# Patient Record
Sex: Male | Born: 1935 | Race: Black or African American | Hispanic: No | Marital: Married | Smoking: Current every day smoker
Health system: Southern US, Community
[De-identification: ages and names within clinical notes are randomized; demographics above are authoritative.]

## PROBLEM LIST (undated history)

## (undated) DIAGNOSIS — I429 Cardiomyopathy, unspecified: Secondary | ICD-10-CM

## (undated) DIAGNOSIS — R413 Other amnesia: Secondary | ICD-10-CM

## (undated) DIAGNOSIS — G309 Alzheimer's disease, unspecified: Secondary | ICD-10-CM

## (undated) DIAGNOSIS — F0280 Dementia in other diseases classified elsewhere without behavioral disturbance: Secondary | ICD-10-CM

## (undated) DIAGNOSIS — F419 Anxiety disorder, unspecified: Secondary | ICD-10-CM

## (undated) HISTORY — DX: Cardiomyopathy, unspecified: I42.9

## (undated) HISTORY — DX: Other amnesia: R41.3

## (undated) HISTORY — DX: Alzheimer's disease, unspecified: G30.9

## (undated) HISTORY — DX: Dementia in other diseases classified elsewhere without behavioral disturbance: F02.80

## (undated) HISTORY — DX: Anxiety disorder, unspecified: F41.9

---

## 2007-08-17 ENCOUNTER — Ambulatory Visit: Payer: Self-pay | Admitting: Family Medicine

## 2009-11-25 ENCOUNTER — Ambulatory Visit: Payer: Self-pay | Admitting: Internal Medicine

## 2009-12-04 ENCOUNTER — Ambulatory Visit: Payer: Self-pay | Admitting: Internal Medicine

## 2009-12-26 ENCOUNTER — Ambulatory Visit: Payer: Self-pay | Admitting: Internal Medicine

## 2010-02-26 ENCOUNTER — Ambulatory Visit: Payer: Self-pay | Admitting: Internal Medicine

## 2010-07-09 ENCOUNTER — Ambulatory Visit: Payer: Self-pay | Admitting: Internal Medicine

## 2010-07-21 ENCOUNTER — Ambulatory Visit: Payer: Self-pay | Admitting: Internal Medicine

## 2010-09-25 ENCOUNTER — Ambulatory Visit: Payer: Self-pay | Admitting: Internal Medicine

## 2010-10-19 ENCOUNTER — Emergency Department: Payer: Self-pay | Admitting: Emergency Medicine

## 2010-11-09 ENCOUNTER — Ambulatory Visit: Payer: Self-pay | Admitting: Internal Medicine

## 2010-12-03 ENCOUNTER — Ambulatory Visit: Payer: Self-pay | Admitting: Cardiothoracic Surgery

## 2010-12-19 ENCOUNTER — Ambulatory Visit: Payer: Self-pay | Admitting: Cardiothoracic Surgery

## 2011-02-08 DIAGNOSIS — R972 Elevated prostate specific antigen [PSA]: Secondary | ICD-10-CM | POA: Diagnosis not present

## 2011-03-01 DIAGNOSIS — I10 Essential (primary) hypertension: Secondary | ICD-10-CM | POA: Diagnosis not present

## 2011-03-01 DIAGNOSIS — F411 Generalized anxiety disorder: Secondary | ICD-10-CM | POA: Diagnosis not present

## 2011-03-02 ENCOUNTER — Ambulatory Visit: Payer: Self-pay | Admitting: Internal Medicine

## 2011-03-02 DIAGNOSIS — R05 Cough: Secondary | ICD-10-CM | POA: Diagnosis not present

## 2011-03-02 DIAGNOSIS — J40 Bronchitis, not specified as acute or chronic: Secondary | ICD-10-CM | POA: Diagnosis not present

## 2011-04-27 DIAGNOSIS — R972 Elevated prostate specific antigen [PSA]: Secondary | ICD-10-CM | POA: Diagnosis not present

## 2011-06-01 DIAGNOSIS — M79609 Pain in unspecified limb: Secondary | ICD-10-CM | POA: Diagnosis not present

## 2011-06-01 DIAGNOSIS — B351 Tinea unguium: Secondary | ICD-10-CM | POA: Diagnosis not present

## 2011-06-01 DIAGNOSIS — M948X9 Other specified disorders of cartilage, unspecified sites: Secondary | ICD-10-CM | POA: Diagnosis not present

## 2011-10-06 DIAGNOSIS — H3550 Unspecified hereditary retinal dystrophy: Secondary | ICD-10-CM | POA: Diagnosis not present

## 2011-10-06 DIAGNOSIS — Z961 Presence of intraocular lens: Secondary | ICD-10-CM | POA: Diagnosis not present

## 2011-11-05 DIAGNOSIS — R972 Elevated prostate specific antigen [PSA]: Secondary | ICD-10-CM | POA: Diagnosis not present

## 2011-11-05 DIAGNOSIS — N400 Enlarged prostate without lower urinary tract symptoms: Secondary | ICD-10-CM | POA: Diagnosis not present

## 2011-12-06 ENCOUNTER — Emergency Department: Payer: Self-pay | Admitting: Emergency Medicine

## 2011-12-06 DIAGNOSIS — S298XXA Other specified injuries of thorax, initial encounter: Secondary | ICD-10-CM | POA: Diagnosis not present

## 2011-12-06 DIAGNOSIS — Z79899 Other long term (current) drug therapy: Secondary | ICD-10-CM | POA: Diagnosis not present

## 2011-12-06 DIAGNOSIS — F329 Major depressive disorder, single episode, unspecified: Secondary | ICD-10-CM | POA: Diagnosis not present

## 2011-12-06 DIAGNOSIS — F17200 Nicotine dependence, unspecified, uncomplicated: Secondary | ICD-10-CM | POA: Diagnosis not present

## 2011-12-06 DIAGNOSIS — R1011 Right upper quadrant pain: Secondary | ICD-10-CM | POA: Diagnosis not present

## 2011-12-06 DIAGNOSIS — S301XXA Contusion of abdominal wall, initial encounter: Secondary | ICD-10-CM | POA: Diagnosis not present

## 2011-12-06 DIAGNOSIS — S20219A Contusion of unspecified front wall of thorax, initial encounter: Secondary | ICD-10-CM | POA: Diagnosis not present

## 2011-12-06 DIAGNOSIS — S3981XA Other specified injuries of abdomen, initial encounter: Secondary | ICD-10-CM | POA: Diagnosis not present

## 2011-12-06 DIAGNOSIS — I10 Essential (primary) hypertension: Secondary | ICD-10-CM | POA: Diagnosis not present

## 2011-12-06 DIAGNOSIS — R079 Chest pain, unspecified: Secondary | ICD-10-CM | POA: Diagnosis not present

## 2011-12-06 DIAGNOSIS — J449 Chronic obstructive pulmonary disease, unspecified: Secondary | ICD-10-CM | POA: Diagnosis not present

## 2011-12-06 LAB — BASIC METABOLIC PANEL
Anion Gap: 4 — ABNORMAL LOW (ref 7–16)
BUN: 29 mg/dL — ABNORMAL HIGH (ref 7–18)
Calcium, Total: 9.6 mg/dL (ref 8.5–10.1)
Chloride: 102 mmol/L (ref 98–107)
Co2: 29 mmol/L (ref 21–32)
EGFR (Non-African Amer.): 43 — ABNORMAL LOW
Osmolality: 276 (ref 275–301)
Potassium: 4.7 mmol/L (ref 3.5–5.1)

## 2011-12-06 LAB — URINALYSIS, COMPLETE
Blood: NEGATIVE
Ketone: NEGATIVE
Leukocyte Esterase: NEGATIVE
Nitrite: NEGATIVE
Ph: 6 (ref 4.5–8.0)
Protein: NEGATIVE
RBC,UR: NONE SEEN /HPF (ref 0–5)
Specific Gravity: 1.011 (ref 1.003–1.030)
WBC UR: <1 /HPF (ref 0–5)

## 2012-01-27 DIAGNOSIS — Z87891 Personal history of nicotine dependence: Secondary | ICD-10-CM | POA: Diagnosis not present

## 2012-01-27 DIAGNOSIS — M25519 Pain in unspecified shoulder: Secondary | ICD-10-CM | POA: Diagnosis not present

## 2012-01-27 DIAGNOSIS — D649 Anemia, unspecified: Secondary | ICD-10-CM | POA: Diagnosis not present

## 2012-01-27 DIAGNOSIS — I10 Essential (primary) hypertension: Secondary | ICD-10-CM | POA: Diagnosis not present

## 2012-03-31 DIAGNOSIS — I10 Essential (primary) hypertension: Secondary | ICD-10-CM | POA: Diagnosis not present

## 2012-03-31 DIAGNOSIS — Z87891 Personal history of nicotine dependence: Secondary | ICD-10-CM | POA: Diagnosis not present

## 2012-03-31 DIAGNOSIS — I32 Pericarditis in diseases classified elsewhere: Secondary | ICD-10-CM | POA: Diagnosis not present

## 2012-03-31 DIAGNOSIS — D649 Anemia, unspecified: Secondary | ICD-10-CM | POA: Diagnosis not present

## 2012-08-04 DIAGNOSIS — M779 Enthesopathy, unspecified: Secondary | ICD-10-CM | POA: Diagnosis not present

## 2012-08-04 DIAGNOSIS — Q6689 Other  specified congenital deformities of feet: Secondary | ICD-10-CM | POA: Diagnosis not present

## 2012-09-27 DIAGNOSIS — IMO0001 Reserved for inherently not codable concepts without codable children: Secondary | ICD-10-CM | POA: Diagnosis not present

## 2012-09-29 DIAGNOSIS — D649 Anemia, unspecified: Secondary | ICD-10-CM | POA: Diagnosis not present

## 2012-09-29 DIAGNOSIS — I10 Essential (primary) hypertension: Secondary | ICD-10-CM | POA: Diagnosis not present

## 2012-09-29 DIAGNOSIS — M25519 Pain in unspecified shoulder: Secondary | ICD-10-CM | POA: Diagnosis not present

## 2012-09-29 DIAGNOSIS — IMO0002 Reserved for concepts with insufficient information to code with codable children: Secondary | ICD-10-CM | POA: Diagnosis not present

## 2012-11-06 DIAGNOSIS — R972 Elevated prostate specific antigen [PSA]: Secondary | ICD-10-CM | POA: Diagnosis not present

## 2013-01-22 DIAGNOSIS — I129 Hypertensive chronic kidney disease with stage 1 through stage 4 chronic kidney disease, or unspecified chronic kidney disease: Secondary | ICD-10-CM | POA: Diagnosis not present

## 2013-01-22 DIAGNOSIS — N181 Chronic kidney disease, stage 1: Secondary | ICD-10-CM | POA: Diagnosis not present

## 2013-01-22 DIAGNOSIS — R413 Other amnesia: Secondary | ICD-10-CM | POA: Diagnosis not present

## 2013-01-22 DIAGNOSIS — J449 Chronic obstructive pulmonary disease, unspecified: Secondary | ICD-10-CM | POA: Diagnosis not present

## 2013-01-22 DIAGNOSIS — F17200 Nicotine dependence, unspecified, uncomplicated: Secondary | ICD-10-CM | POA: Diagnosis not present

## 2013-04-30 DIAGNOSIS — R32 Unspecified urinary incontinence: Secondary | ICD-10-CM | POA: Diagnosis not present

## 2013-04-30 DIAGNOSIS — N400 Enlarged prostate without lower urinary tract symptoms: Secondary | ICD-10-CM | POA: Diagnosis not present

## 2013-06-07 DIAGNOSIS — M898X9 Other specified disorders of bone, unspecified site: Secondary | ICD-10-CM | POA: Diagnosis not present

## 2013-06-07 DIAGNOSIS — M779 Enthesopathy, unspecified: Secondary | ICD-10-CM | POA: Diagnosis not present

## 2013-06-07 DIAGNOSIS — Q6689 Other  specified congenital deformities of feet: Secondary | ICD-10-CM | POA: Diagnosis not present

## 2013-06-19 DIAGNOSIS — J449 Chronic obstructive pulmonary disease, unspecified: Secondary | ICD-10-CM | POA: Diagnosis not present

## 2013-06-19 DIAGNOSIS — N183 Chronic kidney disease, stage 3 unspecified: Secondary | ICD-10-CM | POA: Diagnosis not present

## 2013-06-19 DIAGNOSIS — I10 Essential (primary) hypertension: Secondary | ICD-10-CM | POA: Diagnosis not present

## 2013-06-19 DIAGNOSIS — F0390 Unspecified dementia without behavioral disturbance: Secondary | ICD-10-CM | POA: Diagnosis not present

## 2013-07-09 DIAGNOSIS — M79609 Pain in unspecified limb: Secondary | ICD-10-CM | POA: Diagnosis not present

## 2013-07-09 DIAGNOSIS — M19079 Primary osteoarthritis, unspecified ankle and foot: Secondary | ICD-10-CM | POA: Diagnosis not present

## 2013-07-09 DIAGNOSIS — M779 Enthesopathy, unspecified: Secondary | ICD-10-CM | POA: Diagnosis not present

## 2013-08-07 DIAGNOSIS — E785 Hyperlipidemia, unspecified: Secondary | ICD-10-CM | POA: Diagnosis not present

## 2013-08-07 DIAGNOSIS — G3183 Dementia with Lewy bodies: Secondary | ICD-10-CM | POA: Diagnosis not present

## 2013-08-07 DIAGNOSIS — R634 Abnormal weight loss: Secondary | ICD-10-CM | POA: Diagnosis not present

## 2013-08-07 DIAGNOSIS — E039 Hypothyroidism, unspecified: Secondary | ICD-10-CM | POA: Diagnosis not present

## 2013-08-07 DIAGNOSIS — R413 Other amnesia: Secondary | ICD-10-CM | POA: Diagnosis not present

## 2013-08-07 DIAGNOSIS — R197 Diarrhea, unspecified: Secondary | ICD-10-CM | POA: Diagnosis not present

## 2013-08-07 DIAGNOSIS — F0280 Dementia in other diseases classified elsewhere without behavioral disturbance: Secondary | ICD-10-CM | POA: Diagnosis not present

## 2013-08-07 DIAGNOSIS — F0390 Unspecified dementia without behavioral disturbance: Secondary | ICD-10-CM | POA: Diagnosis not present

## 2013-08-07 DIAGNOSIS — I10 Essential (primary) hypertension: Secondary | ICD-10-CM | POA: Diagnosis not present

## 2013-08-20 DIAGNOSIS — R413 Other amnesia: Secondary | ICD-10-CM | POA: Diagnosis not present

## 2013-09-20 DIAGNOSIS — B351 Tinea unguium: Secondary | ICD-10-CM | POA: Diagnosis not present

## 2013-09-20 DIAGNOSIS — M79609 Pain in unspecified limb: Secondary | ICD-10-CM | POA: Diagnosis not present

## 2013-10-22 DIAGNOSIS — G309 Alzheimer's disease, unspecified: Secondary | ICD-10-CM | POA: Diagnosis not present

## 2013-10-22 DIAGNOSIS — F0280 Dementia in other diseases classified elsewhere without behavioral disturbance: Secondary | ICD-10-CM

## 2013-10-22 DIAGNOSIS — R413 Other amnesia: Secondary | ICD-10-CM

## 2013-10-22 HISTORY — DX: Dementia in other diseases classified elsewhere, unspecified severity, without behavioral disturbance, psychotic disturbance, mood disturbance, and anxiety: F02.80

## 2013-10-22 HISTORY — DX: Other amnesia: R41.3

## 2014-01-23 DIAGNOSIS — R413 Other amnesia: Secondary | ICD-10-CM | POA: Diagnosis not present

## 2014-01-30 DIAGNOSIS — I10 Essential (primary) hypertension: Secondary | ICD-10-CM | POA: Diagnosis not present

## 2014-06-14 DIAGNOSIS — F064 Anxiety disorder due to known physiological condition: Secondary | ICD-10-CM | POA: Diagnosis not present

## 2014-06-14 DIAGNOSIS — J449 Chronic obstructive pulmonary disease, unspecified: Secondary | ICD-10-CM | POA: Diagnosis not present

## 2014-06-14 DIAGNOSIS — Z87891 Personal history of nicotine dependence: Secondary | ICD-10-CM | POA: Diagnosis not present

## 2014-06-14 DIAGNOSIS — I10 Essential (primary) hypertension: Secondary | ICD-10-CM | POA: Diagnosis not present

## 2014-08-15 DIAGNOSIS — T783XXA Angioneurotic edema, initial encounter: Secondary | ICD-10-CM | POA: Diagnosis not present

## 2014-08-15 DIAGNOSIS — F0280 Dementia in other diseases classified elsewhere without behavioral disturbance: Secondary | ICD-10-CM | POA: Diagnosis not present

## 2014-08-15 DIAGNOSIS — L03314 Cellulitis of groin: Secondary | ICD-10-CM | POA: Diagnosis not present

## 2014-08-15 DIAGNOSIS — G309 Alzheimer's disease, unspecified: Secondary | ICD-10-CM | POA: Diagnosis not present

## 2014-08-27 DIAGNOSIS — G309 Alzheimer's disease, unspecified: Secondary | ICD-10-CM | POA: Diagnosis not present

## 2014-08-27 DIAGNOSIS — K611 Rectal abscess: Secondary | ICD-10-CM | POA: Diagnosis not present

## 2014-08-27 DIAGNOSIS — F0280 Dementia in other diseases classified elsewhere without behavioral disturbance: Secondary | ICD-10-CM | POA: Diagnosis not present

## 2014-08-27 DIAGNOSIS — L98499 Non-pressure chronic ulcer of skin of other sites with unspecified severity: Secondary | ICD-10-CM | POA: Diagnosis not present

## 2014-09-11 ENCOUNTER — Ambulatory Visit: Payer: Self-pay | Admitting: Urology

## 2014-10-01 ENCOUNTER — Ambulatory Visit (INDEPENDENT_AMBULATORY_CARE_PROVIDER_SITE_OTHER): Payer: Medicare Other | Admitting: Urology

## 2014-10-01 ENCOUNTER — Encounter: Payer: Self-pay | Admitting: Urology

## 2014-10-01 VITALS — BP 103/66 | HR 90 | Ht 68.0 in | Wt 157.9 lb

## 2014-10-01 DIAGNOSIS — N400 Enlarged prostate without lower urinary tract symptoms: Secondary | ICD-10-CM | POA: Diagnosis not present

## 2014-10-01 DIAGNOSIS — I429 Cardiomyopathy, unspecified: Secondary | ICD-10-CM | POA: Insufficient documentation

## 2014-10-01 HISTORY — DX: Cardiomyopathy, unspecified: I42.9

## 2014-10-01 NOTE — Progress Notes (Signed)
10/01/2014 11:03 AM   Gene [name] [birth year] [id number]  Referring provider: No referring provider defined for this encounter.  Chief Complaint  Patient presents with  . scrotum cyst    pt was started put the pt on abx x 1.5 mths ago    HPI: History remotely of possible mass in the rectum or perineal area. Nonpresent otherwise patient has a vaginal by occasional may be once in a week or once a month or he has some incontinence on the way to the bathroom or incontinence without knowing his voided. This is very rare. Wears no pads he had some nighttime incontinence but was placed on a pattern of awakening him at nigh to urinate He should maintain his tamsulosin.  He should be seen on a yearly basis no PSAs are necessary at this time as he is 81 years old.    PMH: Past Medical History  Diagnosis Date  . Cardiomyopathy 10/01/2014  . AD (Alzheimer's disease) 10/22/2013  . Amnesia 10/22/2013  . Anxiety     Surgical History: No past surgical history on file.  Home Medications:    Medication List       This list is accurate as of: 10/01/14 11:03 AM.  Always use your most recent med list.               ADVAIR DISKUS 250-50 MCG/DOSE Aepb  Generic drug:  Fluticasone-Salmeterol  Inhale into the lungs.     aspirin EC 81 MG tablet  Take by mouth.     Calcium Carbonate-Vitamin D 600-400 MG-UNIT per tablet  Take by mouth.     COLACE 100 MG capsule  Generic drug:  docusate sodium  Take by mouth.     donepezil 10 MG tablet  Commonly known as:  ARICEPT  Take by mouth.     escitalopram 20 MG tablet  Commonly known as:  LEXAPRO  Take by mouth.     fexofenadine 180 MG tablet  Commonly known as:  ALLEGRA  Take by mouth.     lisinopril 5 MG tablet  Commonly known as:  PRINIVIL,ZESTRIL  Take by mouth.     LORazepam 0.5 MG tablet  Commonly known as:  ATIVAN  Take by mouth.     memantine 5 MG tablet  Commonly known as:  NAMENDA  Take by mouth.     MULTIVITAMIN &  MINERAL PO  Take by mouth.     tamsulosin 0.4 MG Caps capsule  Commonly known as:  FLOMAX  Take by mouth.     tiotropium 18 MCG inhalation capsule  Commonly known as:  Browns into inhaler and inhale.     TOVIAZ 4 MG Tb24 tablet  Generic drug:  fesoterodine  Take by mouth.     XOPENEX 0.63 MG/3ML nebulizer solution  Generic drug:  levalbuterol  Inhale into the lungs.     XOPENEX HFA 45 MCG/ACT inhaler  Generic drug:  levalbuterol  2 puff by inhalation every 4 to 6 hours PRN        Allergies: No Known Allergies  Family History: No family history on file.  Social History:  reports that he has been smoking Cigarettes.  He has been smoking about 0.25 packs per day. He does not have any smokeless tobacco history on file. He reports that he does not drink alcohol or use illicit drugs.  ROS: UROLOGY Frequent Urination?: No Hard to postpone urination?: No Burning/pain with urination?: No Get up at night to urinate?: No  Leakage of urine?: No Urine stream starts and stops?: No Trouble starting stream?: No Do you have to strain to urinate?: No Blood in urine?: No Urinary tract infection?: No Sexually transmitted disease?: No Injury to kidneys or bladder?: No Painful intercourse?: No Weak stream?: No Erection problems?: No Penile pain?: No  Gastrointestinal Nausea?: No Vomiting?: No Indigestion/heartburn?: No Diarrhea?: No Constipation?: No  Constitutional Fever: No Night sweats?: No Weight loss?: No Fatigue?: No  Skin Skin rash/lesions?: No Itching?: No  Eyes Blurred vision?: No Double vision?: No  Ears/Nose/Throat Sore throat?: No Sinus problems?: No  Hematologic/Lymphatic Swollen glands?: No Easy bruising?: No  Cardiovascular Leg swelling?: No Chest pain?: No  Respiratory Cough?: No Shortness of breath?: No  Endocrine Excessive thirst?: No  Musculoskeletal Back pain?: No Joint pain?: No  Neurological Headaches?:  No Dizziness?: No  Psychologic Depression?: No Anxiety?: No  Physical Exam: BP 103/66 mmHg  Pulse 90  Ht '5\' 8"'$  (1.727 m)  Wt 157 lb 14.4 oz (71.623 kg)  BMI 24.01 kg/m2  Constitutional:  Alert and oriented, No acute distress. HEENT: [place] AT, moist mucus membranes.  Trachea midline, no masses. Cardiovascular: No clubbing, cyanosis, or edema. Respiratory: Normal respiratory effort, no increased work of breathing. GI: Abdomen is soft, nontender, nondistended, no abdominal masses GU: No CVA tenderness. Small prostate nonnodular no history of prostate cancer. Uncircumcised penis retraction the foreskin is accomplished easily no masses. Foreskin. Scrotum normal testes descended. No at no abscess present no past abscess scarring present rectal sphincter excellent no rectal masses Skin: No rashes, bruises or suspicious lesions. Lymph: No cervical or inguinal adenopathy. Neurologic: Grossly intact, no focal deficits, moving all 4 extremities. Psychiatric: Normal mood and affect.  Laboratory Data: No results found for: WBC, HGB, HCT, MCV, PLT  Lab Results  Component Value Date   CREATININE 1.48* 12/06/2011    No results found for: PSA  No results found for: TESTOSTERONE  No results found for: HGBA1C  Urinalysis    Component Value Date/Time   COLORURINE Yellow 12/06/2011 1313   APPEARANCEUR Clear 12/06/2011 1313   LABSPEC 1.011 12/06/2011 1313   PHURINE 6.0 12/06/2011 1313   GLUCOSEU Negative 12/06/2011 1313   HGBUR Negative 12/06/2011 1313   BILIRUBINUR Negative 12/06/2011 1313   KETONESUR Negative 12/06/2011 1313   PROTEINUR Negative 12/06/2011 1313   NITRITE Negative 12/06/2011 1313   LEUKOCYTESUR Negative 12/06/2011 1313    Pertinent Imaging: None  Assessment & Plan: Resolved perineal scrotal abscess with occasional incontinence that has been addressed by timed voiding and getting up one time at night to make sure the patient voids for his age prostate is exceptionally  small no nodules. He still is having good erectile function  There are no diagnoses linked to this encounter.  No Follow-up on file.  Collier Flowers, [facility] [address], [place], [place] [address] [phone]

## 2014-10-22 DIAGNOSIS — R413 Other amnesia: Secondary | ICD-10-CM | POA: Diagnosis not present

## 2014-11-11 DIAGNOSIS — R413 Other amnesia: Secondary | ICD-10-CM | POA: Diagnosis not present

## 2015-01-21 DIAGNOSIS — T783XXA Angioneurotic edema, initial encounter: Secondary | ICD-10-CM | POA: Diagnosis not present

## 2015-01-21 DIAGNOSIS — J449 Chronic obstructive pulmonary disease, unspecified: Secondary | ICD-10-CM | POA: Diagnosis not present

## 2015-01-21 DIAGNOSIS — F064 Anxiety disorder due to known physiological condition: Secondary | ICD-10-CM | POA: Diagnosis not present

## 2015-01-21 DIAGNOSIS — I10 Essential (primary) hypertension: Secondary | ICD-10-CM | POA: Diagnosis not present

## 2015-04-21 DIAGNOSIS — T783XXA Angioneurotic edema, initial encounter: Secondary | ICD-10-CM | POA: Diagnosis not present

## 2015-04-21 DIAGNOSIS — J449 Chronic obstructive pulmonary disease, unspecified: Secondary | ICD-10-CM | POA: Diagnosis not present

## 2015-04-21 DIAGNOSIS — I10 Essential (primary) hypertension: Secondary | ICD-10-CM | POA: Diagnosis not present

## 2015-04-21 DIAGNOSIS — Z87891 Personal history of nicotine dependence: Secondary | ICD-10-CM | POA: Diagnosis not present

## 2015-07-03 DIAGNOSIS — J449 Chronic obstructive pulmonary disease, unspecified: Secondary | ICD-10-CM | POA: Diagnosis not present

## 2015-07-03 DIAGNOSIS — I10 Essential (primary) hypertension: Secondary | ICD-10-CM | POA: Diagnosis not present

## 2015-07-03 DIAGNOSIS — T783XXA Angioneurotic edema, initial encounter: Secondary | ICD-10-CM | POA: Diagnosis not present

## 2015-07-03 DIAGNOSIS — Z87891 Personal history of nicotine dependence: Secondary | ICD-10-CM | POA: Diagnosis not present

## 2015-09-02 DIAGNOSIS — Z87891 Personal history of nicotine dependence: Secondary | ICD-10-CM | POA: Diagnosis not present

## 2015-09-02 DIAGNOSIS — R5381 Other malaise: Secondary | ICD-10-CM | POA: Diagnosis not present

## 2015-09-02 DIAGNOSIS — E784 Other hyperlipidemia: Secondary | ICD-10-CM | POA: Diagnosis not present

## 2015-09-02 DIAGNOSIS — J449 Chronic obstructive pulmonary disease, unspecified: Secondary | ICD-10-CM | POA: Diagnosis not present

## 2015-09-02 DIAGNOSIS — R634 Abnormal weight loss: Secondary | ICD-10-CM | POA: Diagnosis not present

## 2015-09-02 DIAGNOSIS — I10 Essential (primary) hypertension: Secondary | ICD-10-CM | POA: Diagnosis not present

## 2015-09-05 ENCOUNTER — Ambulatory Visit
Admission: RE | Admit: 2015-09-05 | Discharge: 2015-09-05 | Disposition: A | Discharge status: Home / Self Care | Payer: Medicare Other | Source: Ambulatory Visit | Attending: Internal Medicine | Admitting: Internal Medicine

## 2015-09-05 ENCOUNTER — Other Ambulatory Visit: Payer: Self-pay | Admitting: Internal Medicine

## 2015-09-05 DIAGNOSIS — F17200 Nicotine dependence, unspecified, uncomplicated: Secondary | ICD-10-CM | POA: Insufficient documentation

## 2015-09-05 DIAGNOSIS — R634 Abnormal weight loss: Secondary | ICD-10-CM | POA: Diagnosis not present

## 2015-09-05 DIAGNOSIS — J449 Chronic obstructive pulmonary disease, unspecified: Secondary | ICD-10-CM | POA: Diagnosis not present

## 2015-09-05 DIAGNOSIS — R918 Other nonspecific abnormal finding of lung field: Secondary | ICD-10-CM | POA: Insufficient documentation

## 2015-09-09 DIAGNOSIS — G309 Alzheimer's disease, unspecified: Secondary | ICD-10-CM | POA: Diagnosis not present

## 2015-09-09 DIAGNOSIS — R918 Other nonspecific abnormal finding of lung field: Secondary | ICD-10-CM | POA: Diagnosis not present

## 2015-09-09 DIAGNOSIS — F0280 Dementia in other diseases classified elsewhere without behavioral disturbance: Secondary | ICD-10-CM | POA: Diagnosis not present

## 2015-09-09 DIAGNOSIS — Z87891 Personal history of nicotine dependence: Secondary | ICD-10-CM | POA: Diagnosis not present

## 2015-09-12 ENCOUNTER — Other Ambulatory Visit: Payer: Self-pay | Admitting: Cardiology

## 2015-09-12 DIAGNOSIS — R9389 Abnormal findings on diagnostic imaging of other specified body structures: Secondary | ICD-10-CM

## 2015-09-19 ENCOUNTER — Ambulatory Visit
Admission: RE | Admit: 2015-09-19 | Discharge: 2015-09-19 | Disposition: A | Discharge status: Home / Self Care | Payer: Medicare Other | Source: Ambulatory Visit | Attending: Cardiology | Admitting: Cardiology

## 2015-09-19 DIAGNOSIS — R918 Other nonspecific abnormal finding of lung field: Secondary | ICD-10-CM | POA: Diagnosis not present

## 2015-09-19 DIAGNOSIS — J90 Pleural effusion, not elsewhere classified: Secondary | ICD-10-CM | POA: Insufficient documentation

## 2015-09-19 DIAGNOSIS — C3410 Malignant neoplasm of upper lobe, unspecified bronchus or lung: Secondary | ICD-10-CM | POA: Diagnosis not present

## 2015-09-19 DIAGNOSIS — R590 Localized enlarged lymph nodes: Secondary | ICD-10-CM | POA: Insufficient documentation

## 2015-09-19 DIAGNOSIS — I2510 Atherosclerotic heart disease of native coronary artery without angina pectoris: Secondary | ICD-10-CM | POA: Insufficient documentation

## 2015-09-19 DIAGNOSIS — R9389 Abnormal findings on diagnostic imaging of other specified body structures: Secondary | ICD-10-CM

## 2015-09-19 DIAGNOSIS — I700 Atherosclerosis of aorta: Secondary | ICD-10-CM | POA: Insufficient documentation

## 2015-09-19 LAB — POCT I-STAT CREATININE: CREATININE: 1.8 mg/dL — AB (ref 0.61–1.24)

## 2015-09-19 MED ORDER — IOPAMIDOL (ISOVUE-300) INJECTION 61%: 75.0000 mL | Freq: Once | INTRAVENOUS | Status: DC | PRN

## 2015-09-19 MED ORDER — IOPAMIDOL (ISOVUE-300) INJECTION 61%
60.0000 mL | Freq: Once | INTRAVENOUS | Status: AC | PRN
  Administered 2015-09-19: 60 mL via INTRAVENOUS

## 2016-02-04 ENCOUNTER — Inpatient Hospital Stay: Payer: Medicare Other

## 2016-02-04 ENCOUNTER — Inpatient Hospital Stay
Admission: EM | Admit: 2016-02-04 | Discharge: 2016-02-19 | DRG: 871 | Disposition: E | Payer: Medicare Other | Attending: Internal Medicine | Admitting: Internal Medicine

## 2016-02-04 ENCOUNTER — Emergency Department: Payer: Medicare Other

## 2016-02-04 ENCOUNTER — Encounter: Payer: Self-pay | Admitting: Emergency Medicine

## 2016-02-04 DIAGNOSIS — E162 Hypoglycemia, unspecified: Secondary | ICD-10-CM | POA: Diagnosis present

## 2016-02-04 DIAGNOSIS — N179 Acute kidney failure, unspecified: Secondary | ICD-10-CM | POA: Diagnosis not present

## 2016-02-04 DIAGNOSIS — F17210 Nicotine dependence, cigarettes, uncomplicated: Secondary | ICD-10-CM | POA: Diagnosis present

## 2016-02-04 DIAGNOSIS — E870 Hyperosmolality and hypernatremia: Secondary | ICD-10-CM | POA: Diagnosis present

## 2016-02-04 DIAGNOSIS — N390 Urinary tract infection, site not specified: Secondary | ICD-10-CM | POA: Diagnosis present

## 2016-02-04 DIAGNOSIS — R4182 Altered mental status, unspecified: Secondary | ICD-10-CM | POA: Diagnosis not present

## 2016-02-04 DIAGNOSIS — R55 Syncope and collapse: Secondary | ICD-10-CM | POA: Diagnosis not present

## 2016-02-04 DIAGNOSIS — F0280 Dementia in other diseases classified elsewhere without behavioral disturbance: Secondary | ICD-10-CM | POA: Diagnosis not present

## 2016-02-04 DIAGNOSIS — D649 Anemia, unspecified: Secondary | ICD-10-CM | POA: Diagnosis not present

## 2016-02-04 DIAGNOSIS — Z66 Do not resuscitate: Secondary | ICD-10-CM | POA: Diagnosis present

## 2016-02-04 DIAGNOSIS — R6521 Severe sepsis with septic shock: Secondary | ICD-10-CM | POA: Diagnosis present

## 2016-02-04 DIAGNOSIS — Z7982 Long term (current) use of aspirin: Secondary | ICD-10-CM | POA: Diagnosis not present

## 2016-02-04 DIAGNOSIS — E875 Hyperkalemia: Secondary | ICD-10-CM | POA: Diagnosis not present

## 2016-02-04 DIAGNOSIS — D6959 Other secondary thrombocytopenia: Secondary | ICD-10-CM | POA: Diagnosis present

## 2016-02-04 DIAGNOSIS — I429 Cardiomyopathy, unspecified: Secondary | ICD-10-CM | POA: Diagnosis present

## 2016-02-04 DIAGNOSIS — R4020 Unspecified coma: Secondary | ICD-10-CM | POA: Diagnosis not present

## 2016-02-04 DIAGNOSIS — L8990 Pressure ulcer of unspecified site, unspecified stage: Secondary | ICD-10-CM | POA: Insufficient documentation

## 2016-02-04 DIAGNOSIS — I959 Hypotension, unspecified: Secondary | ICD-10-CM

## 2016-02-04 DIAGNOSIS — E43 Unspecified severe protein-calorie malnutrition: Secondary | ICD-10-CM | POA: Diagnosis present

## 2016-02-04 DIAGNOSIS — E876 Hypokalemia: Secondary | ICD-10-CM | POA: Diagnosis not present

## 2016-02-04 DIAGNOSIS — C3412 Malignant neoplasm of upper lobe, left bronchus or lung: Secondary | ICD-10-CM | POA: Diagnosis present

## 2016-02-04 DIAGNOSIS — R4189 Other symptoms and signs involving cognitive functions and awareness: Secondary | ICD-10-CM

## 2016-02-04 DIAGNOSIS — I130 Hypertensive heart and chronic kidney disease with heart failure and stage 1 through stage 4 chronic kidney disease, or unspecified chronic kidney disease: Secondary | ICD-10-CM | POA: Diagnosis present

## 2016-02-04 DIAGNOSIS — G309 Alzheimer's disease, unspecified: Secondary | ICD-10-CM | POA: Diagnosis not present

## 2016-02-04 DIAGNOSIS — A480 Gas gangrene: Secondary | ICD-10-CM | POA: Diagnosis not present

## 2016-02-04 DIAGNOSIS — E872 Acidosis: Secondary | ICD-10-CM | POA: Diagnosis present

## 2016-02-04 DIAGNOSIS — L309 Dermatitis, unspecified: Secondary | ICD-10-CM | POA: Diagnosis not present

## 2016-02-04 DIAGNOSIS — A419 Sepsis, unspecified organism: Principal | ICD-10-CM | POA: Diagnosis present

## 2016-02-04 DIAGNOSIS — C7800 Secondary malignant neoplasm of unspecified lung: Secondary | ICD-10-CM | POA: Diagnosis present

## 2016-02-04 DIAGNOSIS — Z681 Body mass index (BMI) 19 or less, adult: Secondary | ICD-10-CM

## 2016-02-04 DIAGNOSIS — L89892 Pressure ulcer of other site, stage 2: Secondary | ICD-10-CM | POA: Diagnosis present

## 2016-02-04 DIAGNOSIS — N183 Chronic kidney disease, stage 3 (moderate): Secondary | ICD-10-CM | POA: Diagnosis present

## 2016-02-04 DIAGNOSIS — Z79899 Other long term (current) drug therapy: Secondary | ICD-10-CM | POA: Diagnosis not present

## 2016-02-04 DIAGNOSIS — D631 Anemia in chronic kidney disease: Secondary | ICD-10-CM | POA: Diagnosis present

## 2016-02-04 DIAGNOSIS — D62 Acute posthemorrhagic anemia: Secondary | ICD-10-CM | POA: Diagnosis not present

## 2016-02-04 DIAGNOSIS — G40909 Epilepsy, unspecified, not intractable, without status epilepticus: Secondary | ICD-10-CM | POA: Diagnosis present

## 2016-02-04 DIAGNOSIS — Z515 Encounter for palliative care: Secondary | ICD-10-CM | POA: Diagnosis present

## 2016-02-04 DIAGNOSIS — Z7189 Other specified counseling: Secondary | ICD-10-CM | POA: Diagnosis not present

## 2016-02-04 DIAGNOSIS — I96 Gangrene, not elsewhere classified: Secondary | ICD-10-CM | POA: Diagnosis present

## 2016-02-04 DIAGNOSIS — I509 Heart failure, unspecified: Secondary | ICD-10-CM | POA: Diagnosis present

## 2016-02-04 LAB — COMPREHENSIVE METABOLIC PANEL
ALK PHOS: 77 U/L (ref 38–126)
ALT: 19 U/L (ref 17–63)
AST: 66 U/L — AB (ref 15–41)
Albumin: 1.6 g/dL — ABNORMAL LOW (ref 3.5–5.0)
Anion gap: 24 — ABNORMAL HIGH (ref 5–15)
BILIRUBIN TOTAL: 0.9 mg/dL (ref 0.3–1.2)
BUN: 111 mg/dL — AB (ref 6–20)
CALCIUM: 7 mg/dL — AB (ref 8.9–10.3)
CO2: 14 mmol/L — ABNORMAL LOW (ref 22–32)
CREATININE: 4.97 mg/dL — AB (ref 0.61–1.24)
Chloride: 121 mmol/L — ABNORMAL HIGH (ref 101–111)
GFR calc Af Amer: 11 mL/min — ABNORMAL LOW (ref >60)
GFR calc non Af Amer: 9 mL/min — ABNORMAL LOW (ref >60)
GLUCOSE: 409 mg/dL — AB (ref 65–99)
POTASSIUM: 6.7 mmol/L — AB (ref 3.5–5.1)
Sodium: 159 mmol/L — ABNORMAL HIGH (ref 135–145)
TOTAL PROTEIN: 4.7 g/dL — AB (ref 6.5–8.1)

## 2016-02-04 LAB — CBC WITH DIFFERENTIAL/PLATELET
BASOS ABS: 0.1 10*3/uL (ref 0–0.1)
Basophils Relative: 0 %
EOS ABS: 0 10*3/uL (ref 0–0.7)
EOS PCT: 0 %
HCT: 19.3 % — ABNORMAL LOW (ref 40.0–52.0)
Hemoglobin: 5.4 g/dL — ABNORMAL LOW (ref 13.0–18.0)
LYMPHS PCT: 2 %
Lymphs Abs: 0.4 10*3/uL — ABNORMAL LOW (ref 1.0–3.6)
MCH: 19.8 pg — AB (ref 26.0–34.0)
MCHC: 28 g/dL — ABNORMAL LOW (ref 32.0–36.0)
MCV: 70.9 fL — AB (ref 80.0–100.0)
MONO ABS: 0.5 10*3/uL (ref 0.2–1.0)
Monocytes Relative: 2 %
Neutro Abs: 23 10*3/uL — ABNORMAL HIGH (ref 1.4–6.5)
Neutrophils Relative %: 96 %
PLATELETS: 93 10*3/uL — AB (ref 150–440)
RBC: 2.73 MIL/uL — ABNORMAL LOW (ref 4.40–5.90)
RDW: 21.8 % — AB (ref 11.5–14.5)
WBC: 24 10*3/uL — ABNORMAL HIGH (ref 3.8–10.6)

## 2016-02-04 LAB — URINALYSIS, ROUTINE W REFLEX MICROSCOPIC
Bacteria, UA: NONE SEEN
Bilirubin Urine: NEGATIVE
Glucose, UA: 50 mg/dL — AB
Ketones, ur: NEGATIVE mg/dL
LEUKOCYTES UA: NEGATIVE
Nitrite: NEGATIVE
PROTEIN: 100 mg/dL — AB
Specific Gravity, Urine: 1.016 (ref 1.005–1.030)
pH: 5 (ref 5.0–8.0)

## 2016-02-04 LAB — GLUCOSE, CAPILLARY
GLUCOSE-CAPILLARY: 111 mg/dL — AB (ref 65–99)
GLUCOSE-CAPILLARY: 126 mg/dL — AB (ref 65–99)
GLUCOSE-CAPILLARY: 152 mg/dL — AB (ref 65–99)
Glucose-Capillary: 116 mg/dL — ABNORMAL HIGH (ref 65–99)
Glucose-Capillary: 97 mg/dL (ref 65–99)
Glucose-Capillary: 122 mg/dL — ABNORMAL HIGH (ref 65–99)
Glucose-Capillary: 165 mg/dL — ABNORMAL HIGH (ref 65–99)
Glucose-Capillary: 122 mg/dL — ABNORMAL HIGH (ref 65–99)
Glucose-Capillary: 106 mg/dL — ABNORMAL HIGH (ref 65–99)
Glucose-Capillary: 76 mg/dL (ref 65–99)
Glucose-Capillary: 98 mg/dL (ref 65–99)

## 2016-02-04 LAB — CBC
HCT: 19.2 % — ABNORMAL LOW (ref 40.0–52.0)
HEMOGLOBIN: 4.8 g/dL — AB (ref 13.0–18.0)
MCH: 20.2 pg — ABNORMAL LOW (ref 26.0–34.0)
MCHC: 24.8 g/dL — ABNORMAL LOW (ref 32.0–36.0)
MCV: 81.4 fL (ref 80.0–100.0)
Platelets: 81 10*3/uL — ABNORMAL LOW (ref 150–440)
RBC: 2.36 MIL/uL — AB (ref 4.40–5.90)
RDW: 22.4 % — ABNORMAL HIGH (ref 11.5–14.5)
WBC: 27.8 10*3/uL — AB (ref 3.8–10.6)

## 2016-02-04 LAB — LACTIC ACID, PLASMA
Lactic Acid, Venous: 10.5 mmol/L (ref 0.5–1.9)
Lactic Acid, Venous: 3 mmol/L (ref 0.5–1.9)

## 2016-02-04 LAB — PROTIME-INR
INR: 1.78
PROTHROMBIN TIME: 20.9 s — AB (ref 11.4–15.2)

## 2016-02-04 LAB — SODIUM: Sodium: 158 mmol/L — ABNORMAL HIGH (ref 135–145)

## 2016-02-04 LAB — PREPARE RBC (CROSSMATCH)

## 2016-02-04 LAB — POTASSIUM: Potassium: 5.6 mmol/L — ABNORMAL HIGH (ref 3.5–5.1)

## 2016-02-04 LAB — TROPONIN I: Troponin I: 0.03 ng/mL (ref <0.03)

## 2016-02-04 LAB — APTT: aPTT: 24 seconds (ref 24–36)

## 2016-02-04 LAB — ABO/RH: ABO/RH(D): A POS

## 2016-02-04 MED ORDER — SODIUM BICARBONATE 8.4 % IV SOLN
50.0000 meq | Freq: Once | INTRAVENOUS | Status: AC
  Administered 2016-02-04: 50 meq via INTRAVENOUS
  Filled 2016-02-04: qty 50

## 2016-02-04 MED ORDER — SODIUM CHLORIDE 0.9 % IV SOLN
1.0000 g | Freq: Once | INTRAVENOUS | Status: AC
  Administered 2016-02-04: 1 g via INTRAVENOUS
  Filled 2016-02-04: qty 10

## 2016-02-04 MED ORDER — VANCOMYCIN HCL IN DEXTROSE 1-5 GM/200ML-% IV SOLN
1000.0000 mg | Freq: Once | INTRAVENOUS | Status: AC
  Administered 2016-02-04: 1000 mg via INTRAVENOUS
  Filled 2016-02-04: qty 200

## 2016-02-04 MED ORDER — DONEPEZIL HCL 5 MG PO TABS: 10.0000 mg | ORAL_TABLET | Freq: Every day | ORAL | Status: DC

## 2016-02-04 MED ORDER — SODIUM CHLORIDE 0.9 % IV SOLN: 1.0000 g | Freq: Once | INTRAVENOUS | Status: DC

## 2016-02-04 MED ORDER — SODIUM CHLORIDE 0.9 % IV SOLN
10.0000 mL/h | Freq: Once | INTRAVENOUS | Status: AC
  Administered 2016-02-04: 10 mL/h via INTRAVENOUS

## 2016-02-04 MED ORDER — SODIUM CHLORIDE 0.9 % IV BOLUS (SEPSIS)
1500.0000 mL | Freq: Once | INTRAVENOUS | Status: AC
  Administered 2016-02-04: 1500 mL via INTRAVENOUS

## 2016-02-04 MED ORDER — DEXTROSE 50 % IV SOLN
1.0000 | Freq: Once | INTRAVENOUS | Status: AC
  Administered 2016-02-04: 50 mL via INTRAVENOUS

## 2016-02-04 MED ORDER — PIPERACILLIN-TAZOBACTAM 3.375 G IVPB 30 MIN
3.3750 g | Freq: Once | INTRAVENOUS | Status: AC
  Administered 2016-02-04: 3.375 g via INTRAVENOUS
  Filled 2016-02-04: qty 50

## 2016-02-04 NOTE — ED Notes (Addendum)
Pt lying in stretcher not showing signs of distress from blood transfusion. Pt responding to name with "yea" or "huh". Pt's BP continues to run low (systolic in 33'L and diastolic in 45'G varying). Dr. Genia Harold notified of BP with no new orders at this time for BP.

## 2016-02-04 NOTE — ED Notes (Signed)
Pt had a large, firm BM. Pt changed and peri care completed.

## 2016-02-04 NOTE — H&P (Addendum)
[facility] NAME: Gene [name]    MR#:  [id number]  DATE OF BIRTH:  12/09/1927  DATE OF ADMISSION:  01/20/2016  PRIMARY CARE PHYSICIAN: Cletis Athens, MD   REQUESTING/REFERRING PHYSICIAN: dr Mariea Clonts   CHIEF COMPLAINT:   Unresponsive state HISTORY OF PRESENT ILLNESS:  Gene [name]  is a 83 y.o. male with a known history of Alzheimer's dementia who is brought from nursing facility due to unresponsive state and found to have hypotension with systolic blood pressure initially of 40, hypothermic and hypoglycemic. Per EMS report patient has been unresponsive at the nursing facility for over 2 hours. When he arrived to the emergency room his blood sugar was 40 this is treated with glucose. His systolic blood pressure was 40 and he was given half a liter of fluids by EMS and systolic this was then 60. His temperature was 88.  I spoke with the power of attorney who reports that 5 days ago patient ate a piece of his birthday cake. At baseline patient has severe dementia but apparently yesterday he was in his usual state of health.  Patient given calcium gluconate and sodium bicarbonate for hyperkalemia in the ED. Also given vancomycin and Zosyn for sepsis  PAST MEDICAL HISTORY:   Past Medical History:  Diagnosis Date  . AD (Alzheimer's disease) 10/22/2013  . Amnesia 10/22/2013  . Anxiety   . Cardiomyopathy ([place]) 10/01/2014    PAST SURGICAL HISTORY:  No major surgery  SOCIAL HISTORY:   No recent tobacco, EtOH or IV drug use  FAMILY HISTORY:  No known problems in family  DRUG ALLERGIES:  No Known Allergies  REVIEW OF SYSTEMS:   Review of Systems  Unable to perform ROS: Critical illness    MEDICATIONS AT HOME:   Prior to Admission medications   Medication Sig Start Date End Date Taking? Authorizing Provider  aspirin EC 81 MG tablet Take by mouth.   Yes Historical Provider, MD  Calcium Carbonate-Vitamin D 600-400 MG-UNIT  per tablet Take by mouth.   Yes Historical Provider, MD  donepezil (ARICEPT ODT) 10 MG disintegrating tablet Take 10 mg by mouth at bedtime.   Yes Historical Provider, MD  escitalopram (LEXAPRO) 20 MG tablet Take by mouth.   Yes Historical Provider, MD  fesoterodine (TOVIAZ) 4 MG TB24 tablet Take by mouth.   Yes Historical Provider, MD  fluticasone furoate-vilanterol (BREO ELLIPTA) 200-25 MCG/INH AEPB Inhale 1 puff into the lungs daily.   Yes Historical Provider, MD  loratadine (CLARITIN) 10 MG tablet Take 10 mg by mouth daily.   Yes Historical Provider, MD  LORazepam (ATIVAN) 0.5 MG tablet Take 0.5 mg by mouth 2 (two) times daily.    Yes Historical Provider, MD  omeprazole (PRILOSEC) 20 MG capsule Take 20 mg by mouth daily.   Yes Historical Provider, MD  tamsulosin (FLOMAX) 0.4 MG CAPS capsule Take 0.4 mg by mouth daily.    Yes Historical Provider, MD  traMADol (ULTRAM) 50 MG tablet Take 50 mg by mouth every 6 (six) hours.   Yes Historical Provider, MD  triamterene-hydrochlorothiazide (MAXZIDE-25) 37.5-25 MG tablet Take 0.5 tablets by mouth daily.   Yes Historical Provider, MD  donepezil (ARICEPT) 10 MG tablet Take by mouth. 03/26/14 03/26/15  Historical Provider, MD  levalbuterol Penne Lash HFA) 45 MCG/ACT inhaler 2 puff by inhalation every 4 to 6 hours PRN 09/11/08   Historical Provider, MD  memantine (NAMENDA) 5 MG tablet Take by mouth. 01/23/14 01/23/15  Historical Provider, MD  VITAL SIGNS:  Blood pressure 105/90, pulse (!) 111, temperature (!) 90 F (32.2 C), temperature source Rectal, resp. rate (!) 26, height '5\' 7"'$  (1.702 m), weight 48 kg (105 lb 13.1 oz), SpO2 90 %.  PHYSICAL EXAMINATION:   Physical Exam  Constitutional: No distress.  Cachectic appearing critically ill Wearing Bair hugger  HENT:  Head: Normocephalic.  Eyes: No scleral icterus.  Neck: Normal range of motion. Neck supple. No JVD present. No tracheal deviation present.  Cardiovascular: Normal rate, regular rhythm and  normal heart sounds.  Exam reveals no gallop and no friction rub.   No murmur heard. Pulmonary/Chest: Effort normal and breath sounds normal. No respiratory distress. He has no wheezes. He has no rales. He exhibits no tenderness.  Abdominal: Soft. Bowel sounds are normal. He exhibits no distension and no mass. There is no tenderness. There is no rebound and no guarding.  Musculoskeletal: Normal range of motion. He exhibits tenderness and deformity. He exhibits no edema.  Neurological: He is alert.  Open eyes but There is no verbal communication  Skin: Skin is warm. No erythema.  Abrasion on hips and left leg      LABORATORY PANEL:   CBC  Recent Labs Lab 02/08/2016 2008  WBC 27.8*  HGB 4.8*  HCT 19.2*  PLT PENDING   ------------------------------------------------------------------------------------------------------------------  Chemistries   Recent Labs Lab 01/22/2016 1831  NA 159*  K 6.7*  CL 121*  CO2 14*  GLUCOSE 409*  BUN 111*  CREATININE 4.97*  CALCIUM 7.0*  AST 66*  ALT 19  ALKPHOS 77  BILITOT 0.9   ------------------------------------------------------------------------------------------------------------------  Cardiac Enzymes  Recent Labs Lab 01/21/2016 1831  TROPONINI 0.03*   ------------------------------------------------------------------------------------------------------------------  RADIOLOGY:  Dg Chest Port 1 View  Result Date: 01/23/2016 CLINICAL DATA:  Found unresponsive. EXAM: PORTABLE CHEST 1 VIEW COMPARISON:  09/05/2015 FINDINGS: Lungs are hyperexpanded as before. The left upper lobe neoplasm has progressed in the interval with complete opacification of the left upper lung on the current study. Right lung is clear. Heart size stable. The visualized bony structures of the thorax are intact. Multiple old posterior left rib fractures noted. Telemetry leads overlie the chest. IMPRESSION: Interval progression of known left upper lobe lung mass.  Emphysema. Electronically Signed   By: Misty Stanley M.D.   On: 01/21/2016 19:04    EKG:   Poor quality EKG without evidence of ST elevation or depression EKG is on a shaky background  IMPRESSION AND PLAN:   83 year old male with Alzheimer's dementia and protein calorie malnutrition who presents with sepsis.  1. Severe septic shocks with hypoglycemia, hypotension, hypothermia and leukocytosis of unclear etiology Continue broad-spectrum antibiotics with cefepime and vancomycin Follow-up on final cultures POA does not want pressors Continue IVF and monitor of BP   2. Acute Anemia with guaiac negative stools as per ER physician: POA would like blood transfusion. Orders written by ER physician  3. Severe hypernatremia due to poor by mouth intake and dementia Follow sodium levels Start D5  4. Hyperkalemia: Treated in the emergency room I will recheck potassium levels and treat if needed  5. Hypoglycemia due to sepsis and poor by mouth intake:POCT q 2 h  6. Acute kidney injury due to poor by mouth intake with anuria Discontinue nephrotoxic agents including triamterene/HCTZ Start IV fluids BMP for a.m.  7. Alzheimer's dementia on donepezil  Patient is very critically ill and poor prognosis. I discussed this with the power of attorney [name], [phone].Marland Kitchen Patient is DO NOT  RESUSCITATE.  NO PRESSORS AND WILL OBTAIN PALLIATIVE CARE CONSULT   All the records are reviewed and case  discussed plan as outlined above with ED provider. Management plans discussed with the [name], [phone].POA who is in agreement  CODE STATUS: dnr  Critical care TOTAL TIME TAKING CARE OF THIS PATIENT: 55 minutes.    Lizbett Garciagarcia M.D on 01/31/2016 at 8:33 PM  Between 7am to 6pm - Pager - [phone]  After 6pm go to [email or web address] - password [email or web address] [email or web address]  Office  [phone]  CC: Primary care physician; Cletis Athens, MD

## 2016-02-04 NOTE — ED Notes (Signed)
CBG 43. 

## 2016-02-04 NOTE — ED Notes (Signed)
Getting report on pt from Mickel Baas, [place]. At this time trying to get an O2 and blood pressure reading. Annie Main RN is trying to get a manual BP at this time.

## 2016-02-04 NOTE — ED Triage Notes (Signed)
Patient from [facility] via [facility]. Staff reports patient found unresponsive. EMS reports scene CBG of 40 given amp of D50 in field. Upon arrival to ED, patient's CBG was 43. Patient responsive to painful stimuli only. Patient's BP on scene was initially palpated at 40, then  69/32 after 2557m of NS.

## 2016-02-04 NOTE — ED Notes (Signed)
Pt currently lying in stretcher, has eyes open and moving both arms trying to take off the blanket warmer sheet. Helped pt get comfortable in bed and told pt he is in hospital getting care and that he needs to keep the warmer sheet on for a very critical temp. Pt stopped trying to remove sheet at this time.

## 2016-02-04 NOTE — ED Notes (Signed)
Called pharmacy about calcium gluconate, states they will tube it to the ED soon.

## 2016-02-04 NOTE — ED Notes (Signed)
Pt becoming more verbal, making groaning sounds. Pt moving right arm that has BP cuff on it, BP readings continue to vary due to movement. Will keep trying to find a location to get an accurate BP read.

## 2016-02-04 NOTE — Progress Notes (Addendum)
Pharmacy Antibiotic Note  Gene [name] is a 83 y.o. male admitted on 01/27/2016 with sepsis.  Pharmacy has been consulted for cefepime, Zosyn, and vancomycin dosing.  Plan: 1. Cefepime 1 gm IV Q24H - will continue to adjust based on renal function 2. Zosyn 3.375 gm IV Q12H EI 3. Vancomycin 1 gm IV x 1 in ED. Will check random level tomorrow afternoon and dose based on levels until AKI resolved.  Height: '5\' 7"'$  (170.2 cm) Weight: 105 lb 13.1 oz (48 kg) IBW/kg (Calculated) : 66.1  Temp (24hrs), Avg:90 F (32.2 C), Min:90 F (32.2 C), Max:90 F (32.2 C)   Recent Labs Lab 02/06/2016 1831 01/20/2016 2008  WBC 24.0* 27.8*  CREATININE 4.97*  --   LATICACIDVEN 10.5*  --     Estimated Creatinine Clearance: 7.1 mL/min (by C-G formula based on SCr of 4.97 mg/dL (H)).    No Known Allergies  Thank you for allowing pharmacy to be a part of this patient's care.  Laural Benes, Pharm.D., BCPS Clinical Pharmacist 01/25/2016 10:05 PM

## 2016-02-04 NOTE — ED Provider Notes (Signed)
[facility] Emergency Department Provider Note  ____________________________________________  Time seen: Approximately 6:35 PM  I have reviewed the triage vital signs and the nursing notes.   HISTORY  Chief Complaint Altered Mental Status and Hypoglycemia  The patient is unable to give any history.  HPI Gene [name] is a 83 y.o. male NH patientwith a history of seizure disorder, dementia, anemia brought for unresponsiveness, hypotension, hypoglycemia. Per report, EMS states that for the past 2 hours, the patient has been unresponsive at his nursing facility. Upon arrival, he was noted to moan and move all 4 extremities but otherwise be unresponsive. His blood sugar was 40 and treated with glucose with improvement. He had a blood pressure 40 over palp, and he was given fluids which improved his blood pressure to 60 over palp. His axillary temperature was 88.0.     Past Medical History:  Diagnosis Date  . AD (Alzheimer's disease) 10/22/2013  . Amnesia 10/22/2013  . Anxiety   . Cardiomyopathy ([place]) 10/01/2014    Patient Active Problem List   Diagnosis Date Noted  . Cardiomyopathy ([place]) 10/01/2014  . AD (Alzheimer's disease) 10/22/2013  . Amnesia 10/22/2013    History reviewed. No pertinent surgical history.  Current Outpatient Rx  . Order #: [id number] Class: Historical Med  . Order #: [id number] Class: Historical Med  . Order #: [id number] Class: Historical Med  . Order #: [id number] Class: Historical Med  . Order #: [id number] Class: Historical Med  . Order #: [id number] Class: Historical Med  . Order #: [id number] Class: Historical Med  . Order #: [id number] Class: Historical Med  . Order #: [id number] Class: Historical Med  . Order #: [id number] Class: Historical Med  . Order #: [id number] Class: Historical Med  . Order #: [id number] Class: Historical Med  . Order #: [id number] Class: Historical Med  . Order #: [id number] Class: Historical Med  . Order #:  [id number] Class: Historical Med  . Order #: [id number] Class: Historical Med    Allergies Patient has no known allergies.  No family history on file.  Social History Social History  Substance Use Topics  . Smoking status: Current Every Day Smoker    Packs/day: 0.25    Types: Cigarettes  . Smokeless tobacco: Not on file  . Alcohol use No    Review of Systems Unable to obtain due to patient condition.  ____________________________________________   PHYSICAL EXAM:  VITAL SIGNS: ED Triage Vitals [02/13/2016 1832]  Enc Vitals Group     BP      Pulse Rate 96     Resp 18     Temp (!) 90 F (32.2 C)     Temp Source Rectal     SpO2      Weight      Height      Head Circumference      Peak Flow      Pain Score      Pain Loc      Pain Edu?      Excl. in [place]?     Constitutional: The patient is cachectic and chronically ill-appearing, has very dry mucous membranes, multiple pressure ulcers in various stages of healing. The patient has his eyes closed and does not respond to verbal stimulus. He is cold to the touch. He moves my hand away with sternal rub. He has a mild gag reflex. Eyes: Positive arcus senilis. +2 mm pupils that are unresponsive bilaterally. Does shut his eyelids when I try to open his eyes. Head: Atraumatic. Nose: No congestion/rhinnorhea. Mouth/Throat: Mucous membranes are very dry.  Neck:  No stridor.  Supple.  No obvious meningismus. Cardiovascular: Normal rate, regular rhythm. No murmurs, rubs or gallops.  Respiratory: Tachypnea with mild accessory muscle use but no retractions. No obvious wheezes rales or rhonchi. Gastrointestinal: Soft, nontender and nondistended.  No guarding or rebound.  No peritoneal signs. Genitourinary: Normal-appearing penis. Pressure ulcer over the left iliac crest. Musculoskeletal: No LE edema. Mild contraction of the legs bilaterally. Neurologic:  Patient is unresponsive except to painful stimulus. His face is symmetric. His pupils  are reactive but I'm unable to check his extraocular movements. He does not have a grossly asymmetric face. He does move all 4 extremities. Skin:  Multiple pressure ulcers, including on the left iliac crest in the left knee. Psychiatric: Unable to assess.  ____________________________________________   LABS (all labs ordered are listed, but only abnormal results are displayed)  Labs Reviewed  COMPREHENSIVE METABOLIC PANEL - Abnormal; Notable for the following:       Result Value   Sodium 159 (*)    Potassium 6.7 (*)    Chloride 121 (*)    CO2 14 (*)    Glucose, Bld 409 (*)    BUN 111 (*)    Creatinine, Ser 4.97 (*)    Calcium 7.0 (*)    Total Protein 4.7 (*)    Albumin 1.6 (*)    AST 66 (*)    GFR calc non Af Amer 9 (*)    GFR calc Af Amer 11 (*)    Anion gap 24 (*)    All other components within normal limits  CBC WITH DIFFERENTIAL/PLATELET - Abnormal; Notable for the following:    WBC 24.0 (*)    RBC 2.73 (*)    Hemoglobin 5.4 (*)    HCT 19.3 (*)    MCV 70.9 (*)    MCH 19.8 (*)    MCHC 28.0 (*)    RDW 21.8 (*)    Platelets 93 (*)    Neutro Abs 23.0 (*)    Lymphs Abs 0.4 (*)    All other components within normal limits  LACTIC ACID, PLASMA - Abnormal; Notable for the following:    Lactic Acid, Venous 10.5 (*)    All other components within normal limits  TROPONIN I - Abnormal; Notable for the following:    Troponin I 0.03 (*)    All other components within normal limits  BLOOD GAS, VENOUS - Abnormal; Notable for the following:    pH, Ven 7.01 (*)    Bicarbonate 13.6 (*)    Acid-base deficit 17.6 (*)    All other components within normal limits  PROTIME-INR - Abnormal; Notable for the following:    Prothrombin Time 20.9 (*)    All other components within normal limits  GLUCOSE, CAPILLARY - Abnormal; Notable for the following:    Glucose-Capillary 111 (*)    All other components within normal limits  GLUCOSE, CAPILLARY - Abnormal; Notable for the following:     Glucose-Capillary 116 (*)    All other components within normal limits  GLUCOSE, CAPILLARY - Abnormal; Notable for the following:    Glucose-Capillary 165 (*)    All other components within normal limits  GLUCOSE, CAPILLARY - Abnormal; Notable for the following:    Glucose-Capillary 122 (*)    All other components within normal limits  CULTURE, BLOOD (ROUTINE X 2)  CULTURE, BLOOD (ROUTINE X 2)  URINE CULTURE  APTT  GLUCOSE, CAPILLARY  URINALYSIS, ROUTINE W REFLEX MICROSCOPIC  LACTIC ACID, PLASMA  TYPE AND  SCREEN  PREPARE RBC (CROSSMATCH)   ____________________________________________  EKG  ED ECG REPORT I, Eula Listen, the attending physician, personally viewed and interpreted this ECG.   Date: 02/12/2016  EKG Time: 1907  Rate: 108  Rhythm: normal EKG, normal sinus rhythm, unchanged from previous tracings, atrial fibrillation    Axis: unclear due to poor baseline tracing  Intervals:none  ST&T Change: no STEMI; poor baseline tracing limits interpretation  ____________________________________________  RADIOLOGY  Dg Chest Port 1 View  Result Date: 01/31/2016 CLINICAL DATA:  Found unresponsive. EXAM: PORTABLE CHEST 1 VIEW COMPARISON:  09/05/2015 FINDINGS: Lungs are hyperexpanded as before. The left upper lobe neoplasm has progressed in the interval with complete opacification of the left upper lung on the current study. Right lung is clear. Heart size stable. The visualized bony structures of the thorax are intact. Multiple old posterior left rib fractures noted. Telemetry leads overlie the chest. IMPRESSION: Interval progression of known left upper lobe lung mass. Emphysema. Electronically Signed   By: Misty Stanley M.D.   On: 02/12/2016 19:04    ____________________________________________   PROCEDURES  Procedure(s) performed: None  Procedures  Critical Care performed: Yes, see critical care note(s) ____________________________________________   INITIAL  IMPRESSION / ASSESSMENT AND PLAN / ED COURSE  Pertinent labs & imaging results that were available during my care of the patient were reviewed by me and considered in my medical decision making (see chart for details).  83 y.o. male presenting to the emergency department grossly unresponsive with hypotension, hypoglycemia to the 40s.  I am concerned that he is septic. At this time, he is protecting his airway but I do not have a date on his oxygen saturation due to his hypothermia in the morning that. I have initiated 2 intravenous lines with warm IV fluids, BAIR hugger, a temperature Foley catheter, blood cultures, empiric antibiotics, and aggressive blood sugar monitoring. At this time, the patient does not have DNR/DNI paperwork so I will make an attempt to speak with his POA.   8:04 PM I spoke with the patient's sister-in-law, who is the Healthcare power of attorney.  [place], [place] [phone].  She reports that she last saw the patient at Christmas, and at that time he had some mild dementia but was able to speak and walk on his own. Several days ago, some other family members got him a cake for his birthday, and stated that his energy was decreased but he was still able to [facility]. I have described the patient's clinical status at this time with her, and she has elected to proceed with making the patient DO NOT RESUSCITATE/DO NOT INTUBATE. I will call her back with any updates.  CRITICAL CARE Performed by: Eula Listen   Total critical care time: 60 minutes  Critical care time was exclusive of separately billable procedures and treating other patients.  Critical care was necessary to treat or prevent imminent or life-threatening deterioration.  Critical care was time spent personally by me on the following activities: development of treatment plan with patient and/or surrogate as well as nursing, discussions with consultants, evaluation of patient's response to treatment,  examination of patient, obtaining history from patient or surrogate, ordering and performing treatments and interventions, ordering and review of laboratory studies, ordering and review of radiographic studies, pulse oximetry and re-evaluation of patient's condition.  ----------------------------------------- 8:02 PM on 01/19/2016 -----------------------------------------  I've been monitoring the patient's condition. Initially he remained hypotensive, but after reverse Trendelenburg and IV fluids, the patient now has a blood  pressure of 119/108. He has difficult to obtain blood pressures and we are using his thigh. He is now moving more and moaning more, making more verbal noises, but still not answering to questions. His blood sugars have remained stable. He has acute renal failure with hypernatremia and hyperkalemia. I have treated his hyperkalemia with calcium gluconate and bicarbonate. His lactic acid is 10.0, and his VBG shows acidemia with a pH of 7.1. In addition, he has a white blood cell count of 24 and a hematocrit of 19.3. I have performed a rectal examination which shows brown stool that is guaiac negative. He any other obvious sources for blood loss on my examination. I'll plan admission at this time, the patient remains in critical condition.  I've spoken with the patient's POA, Mrs. Leotis Pain, and she understands the patient's clinical condition at this time. We have discussed pressors, and she would not wish these, although at this point it is not necessary due to his improving blood pressure. She does know about his anemia, and we will repeat his blood counts in case this is a lab errior, but plan to transfuse 2 units if the repeat labs are unchanged.  ____________________________________________  FINAL CLINICAL IMPRESSION(S) / ED DIAGNOSES  Final diagnoses:  Unresponsiveness  Hypoglycemia  Hypotension, unspecified hypotension type  Anemia, unspecified type  Acute renal failure,  unspecified acute renal failure type ([place])  Hyperkalemia    Clinical Course       NEW MEDICATIONS STARTED DURING THIS VISIT:  New Prescriptions   No medications on file      Eula Listen, MD 01/29/2016 2004

## 2016-02-05 DIAGNOSIS — Z515 Encounter for palliative care: Secondary | ICD-10-CM

## 2016-02-05 DIAGNOSIS — A419 Sepsis, unspecified organism: Principal | ICD-10-CM

## 2016-02-05 DIAGNOSIS — N179 Acute kidney failure, unspecified: Secondary | ICD-10-CM

## 2016-02-05 DIAGNOSIS — F0280 Dementia in other diseases classified elsewhere without behavioral disturbance: Secondary | ICD-10-CM

## 2016-02-05 DIAGNOSIS — Z7189 Other specified counseling: Secondary | ICD-10-CM

## 2016-02-05 DIAGNOSIS — G309 Alzheimer's disease, unspecified: Secondary | ICD-10-CM

## 2016-02-05 LAB — BLOOD GAS, VENOUS
ACID-BASE DEFICIT: 17.6 mmol/L — AB (ref 0.0–2.0)
Acid-base deficit: 23.6 mmol/L — ABNORMAL HIGH (ref 0.0–2.0)
BICARBONATE: 13.6 mmol/L — AB (ref 20.0–28.0)
Bicarbonate: 9 mmol/L — ABNORMAL LOW (ref 20.0–28.0)
FIO2: 0.36
FIO2: 0.44
PCO2 VEN: 54 mmHg (ref 44.0–60.0)
Patient temperature: 37
Patient temperature: 37
pCO2, Ven: 45 mmHg (ref 44.0–60.0)
pH, Ven: 7.01 — CL (ref 7.250–7.430)
pH, Ven: 6.91 — CL (ref 7.250–7.430)

## 2016-02-05 LAB — GLUCOSE, CAPILLARY
GLUCOSE-CAPILLARY: 101 mg/dL — AB (ref 65–99)
GLUCOSE-CAPILLARY: 110 mg/dL — AB (ref 65–99)
GLUCOSE-CAPILLARY: 116 mg/dL — AB (ref 65–99)
GLUCOSE-CAPILLARY: 18 mg/dL — AB (ref 65–99)
GLUCOSE-CAPILLARY: 28 mg/dL — AB (ref 65–99)
GLUCOSE-CAPILLARY: 43 mg/dL — AB (ref 65–99)
GLUCOSE-CAPILLARY: 53 mg/dL — AB (ref 65–99)
GLUCOSE-CAPILLARY: 76 mg/dL (ref 65–99)
Glucose-Capillary: 130 mg/dL — ABNORMAL HIGH (ref 65–99)
Glucose-Capillary: 42 mg/dL — CL (ref 65–99)
Glucose-Capillary: 105 mg/dL — ABNORMAL HIGH (ref 65–99)
Glucose-Capillary: 125 mg/dL — ABNORMAL HIGH (ref 65–99)
Glucose-Capillary: <10 mg/dL — CL (ref 65–99)
Glucose-Capillary: <10 mg/dL — CL (ref 65–99)
Glucose-Capillary: <10 mg/dL — CL (ref 65–99)

## 2016-02-05 LAB — CBC
HCT: 29 % — ABNORMAL LOW (ref 40.0–52.0)
Hemoglobin: 9.1 g/dL — ABNORMAL LOW (ref 13.0–18.0)
MCH: 22.5 pg — ABNORMAL LOW (ref 26.0–34.0)
MCHC: 31.5 g/dL — ABNORMAL LOW (ref 32.0–36.0)
MCV: 71.5 fL — ABNORMAL LOW (ref 80.0–100.0)
PLATELETS: 98 10*3/uL — AB (ref 150–440)
RBC: 4.06 MIL/uL — AB (ref 4.40–5.90)
RDW: 24.3 % — ABNORMAL HIGH (ref 11.5–14.5)
WBC: 25.1 10*3/uL — AB (ref 3.8–10.6)

## 2016-02-05 LAB — BASIC METABOLIC PANEL
ANION GAP: 12 (ref 5–15)
BUN: 97 mg/dL — ABNORMAL HIGH (ref 6–20)
CALCIUM: 5.7 mg/dL — AB (ref 8.9–10.3)
CHLORIDE: 129 mmol/L — AB (ref 101–111)
CO2: 18 mmol/L — AB (ref 22–32)
CREATININE: 4.09 mg/dL — AB (ref 0.61–1.24)
GFR calc non Af Amer: 12 mL/min — ABNORMAL LOW (ref >60)
GFR, EST AFRICAN AMERICAN: 14 mL/min — AB (ref >60)
Glucose, Bld: 361 mg/dL — ABNORMAL HIGH (ref 65–99)
Potassium: 3.7 mmol/L (ref 3.5–5.1)
SODIUM: 159 mmol/L — AB (ref 135–145)

## 2016-02-05 LAB — MRSA PCR SCREENING: MRSA by PCR: NEGATIVE

## 2016-02-05 LAB — SODIUM
SODIUM: 157 mmol/L — AB (ref 135–145)
SODIUM: 160 mmol/L — AB (ref 135–145)
Sodium: 159 mmol/L — ABNORMAL HIGH (ref 135–145)
Sodium: 160 mmol/L — ABNORMAL HIGH (ref 135–145)

## 2016-02-05 LAB — VANCOMYCIN, RANDOM: Vancomycin Rm: 12

## 2016-02-05 MED ORDER — SENNOSIDES-DOCUSATE SODIUM 8.6-50 MG PO TABS: 1.0000 | ORAL_TABLET | Freq: Every evening | ORAL | Status: DC | PRN

## 2016-02-05 MED ORDER — DEXTROSE 50 % IV SOLN
1.0000 | Freq: Once | INTRAVENOUS | Status: AC
  Administered 2016-02-05: 50 mL via INTRAVENOUS

## 2016-02-05 MED ORDER — DEXTROSE-NACL 5-0.9 % IV SOLN
INTRAVENOUS | Status: DC
  Administered 2016-02-05 (×2): via INTRAVENOUS
  Filled 2016-02-05: qty 1000

## 2016-02-05 MED ORDER — ESCITALOPRAM OXALATE 10 MG PO TABS: 20.0000 mg | ORAL_TABLET | Freq: Every day | ORAL | Status: DC

## 2016-02-05 MED ORDER — ORAL CARE MOUTH RINSE
15.0000 mL | Freq: Two times a day (BID) | OROMUCOSAL | Status: DC
  Administered 2016-02-05 – 2016-02-08 (×7): 15 mL via OROMUCOSAL

## 2016-02-05 MED ORDER — HYDROCODONE-ACETAMINOPHEN 5-325 MG PO TABS: 1.0000 | ORAL_TABLET | ORAL | Status: DC | PRN

## 2016-02-05 MED ORDER — SODIUM CHLORIDE 0.9 % IV BOLUS (SEPSIS)
500.0000 mL | Freq: Once | INTRAVENOUS | Status: AC
  Administered 2016-02-05: 500 mL via INTRAVENOUS

## 2016-02-05 MED ORDER — FUROSEMIDE 10 MG/ML IJ SOLN
20.0000 mg | Freq: Once | INTRAMUSCULAR | Status: AC
  Administered 2016-02-05: 20 mg via INTRAVENOUS
  Filled 2016-02-05: qty 4

## 2016-02-05 MED ORDER — TAMSULOSIN HCL 0.4 MG PO CAPS: 0.4000 mg | ORAL_CAPSULE | Freq: Every day | ORAL | Status: DC

## 2016-02-05 MED ORDER — DONEPEZIL HCL 10 MG PO TBDP: 10.0000 mg | ORAL_TABLET | Freq: Every day | ORAL | Status: DC

## 2016-02-05 MED ORDER — CEFEPIME-DEXTROSE 1 GM/50ML IV SOLR
1.0000 g | Freq: Once | INTRAVENOUS | Status: AC
  Administered 2016-02-05: 1 g via INTRAVENOUS
  Filled 2016-02-05: qty 50

## 2016-02-05 MED ORDER — DEXTROSE 50 % IV SOLN
INTRAVENOUS | Status: AC
  Administered 2016-02-05: 50 mL via INTRAVENOUS
  Filled 2016-02-05: qty 50

## 2016-02-05 MED ORDER — ACETAMINOPHEN 325 MG PO TABS: 650.0000 mg | ORAL_TABLET | Freq: Four times a day (QID) | ORAL | Status: DC | PRN

## 2016-02-05 MED ORDER — CEFEPIME-DEXTROSE 1 GM/50ML IV SOLR
1.0000 g | Freq: Every day | INTRAVENOUS | Status: DC
  Administered 2016-02-05 – 2016-02-06 (×2): 1 g via INTRAVENOUS
  Filled 2016-02-05 (×3): qty 50

## 2016-02-05 MED ORDER — PIPERACILLIN-TAZOBACTAM 3.375 G IVPB: 3.3750 g | Freq: Two times a day (BID) | INTRAVENOUS | Status: DC

## 2016-02-05 MED ORDER — ONDANSETRON HCL 4 MG/2ML IJ SOLN: 4.0000 mg | Freq: Four times a day (QID) | INTRAMUSCULAR | Status: DC | PRN

## 2016-02-05 MED ORDER — HALOPERIDOL LACTATE 5 MG/ML IJ SOLN
1.0000 mg | Freq: Once | INTRAMUSCULAR | Status: AC
  Administered 2016-02-05: 22:00:00 1 mg via INTRAMUSCULAR
  Filled 2016-02-05: qty 1

## 2016-02-05 MED ORDER — CHLORHEXIDINE GLUCONATE 0.12 % MT SOLN
15.0000 mL | Freq: Two times a day (BID) | OROMUCOSAL | Status: DC
Start: 2016-02-05 — End: 2016-02-09
  Administered 2016-02-05 – 2016-02-08 (×8): 15 mL via OROMUCOSAL
  Filled 2016-02-05 (×7): qty 15

## 2016-02-05 MED ORDER — DEXTROSE 10 % IV SOLN
INTRAVENOUS | Status: DC
  Administered 2016-02-05: 23:00:00 via INTRAVENOUS

## 2016-02-05 MED ORDER — FLUTICASONE FUROATE-VILANTEROL 200-25 MCG/INH IN AEPB
1.0000 | INHALATION_SPRAY | Freq: Every day | RESPIRATORY_TRACT | Status: DC
  Filled 2016-02-05: qty 28

## 2016-02-05 MED ORDER — DEXTROSE 50 % IV SOLN
INTRAVENOUS | Status: AC
  Administered 2016-02-05: 22:00:00 50 mL via INTRAVENOUS
  Filled 2016-02-05: qty 50

## 2016-02-05 MED ORDER — VANCOMYCIN HCL 500 MG IV SOLR
500.0000 mg | INTRAVENOUS | Status: DC
  Administered 2016-02-05: 15:00:00 500 mg via INTRAVENOUS
  Filled 2016-02-05 (×2): qty 500

## 2016-02-05 MED ORDER — BISACODYL 5 MG PO TBEC: 5.0000 mg | DELAYED_RELEASE_TABLET | Freq: Every day | ORAL | Status: DC | PRN

## 2016-02-05 MED ORDER — ASPIRIN EC 81 MG PO TBEC: 81.0000 mg | DELAYED_RELEASE_TABLET | Freq: Every day | ORAL | Status: DC

## 2016-02-05 MED ORDER — ONDANSETRON HCL 4 MG PO TABS: 4.0000 mg | ORAL_TABLET | Freq: Four times a day (QID) | ORAL | Status: DC | PRN

## 2016-02-05 MED ORDER — PANTOPRAZOLE SODIUM 40 MG IV SOLR
40.0000 mg | Freq: Two times a day (BID) | INTRAVENOUS | Status: DC
  Administered 2016-02-05 – 2016-02-08 (×7): 40 mg via INTRAVENOUS
  Filled 2016-02-05 (×7): qty 40

## 2016-02-05 MED ORDER — ACETAMINOPHEN 650 MG RE SUPP
650.0000 mg | Freq: Four times a day (QID) | RECTAL | Status: DC | PRN
  Administered 2016-02-06: 650 mg via RECTAL
  Filled 2016-02-05: qty 1

## 2016-02-05 MED ORDER — DEXTROSE 50 % IV SOLN
50.0000 mL | Freq: Once | INTRAVENOUS | Status: AC
  Administered 2016-02-05: 50 mL via INTRAVENOUS
  Filled 2016-02-05: qty 50

## 2016-02-05 MED ORDER — INSULIN ASPART 100 UNIT/ML ~~LOC~~ SOLN
0.0000 [IU] | Freq: Three times a day (TID) | SUBCUTANEOUS | Status: DC
  Administered 2016-02-06: 17:00:00 1 [IU] via SUBCUTANEOUS
  Filled 2016-02-05: qty 1

## 2016-02-05 MED ORDER — CEFEPIME-DEXTROSE 1 GM/50ML IV SOLR: 1.0000 g | Freq: Every day | INTRAVENOUS | Status: DC

## 2016-02-05 MED ORDER — DEXTROSE 50 % IV SOLN
1.0000 | Freq: Once | INTRAVENOUS | Status: AC
  Administered 2016-02-05: 23:00:00 50 mL via INTRAVENOUS
  Filled 2016-02-05: qty 50

## 2016-02-05 NOTE — ED Notes (Signed)
Pt states "roll over", pt trying to roll himself to his right side. Pt assisted with reposition, resting at this time.

## 2016-02-05 NOTE — ED Notes (Signed)
Pt continues to pull at O2, electrical leads and foley. Charge nurse notified, sitter placed with pt and MD made aware. MD states pt can come off of telemetry.

## 2016-02-05 NOTE — ED Notes (Signed)
Pt had BM, it was firm. Pt changed and given peri care.

## 2016-02-05 NOTE — ED Notes (Signed)
Pharmacy called back to let this nurse know they also did not have Dextrose 5%-0.9% Sodium chloride infusion. Charge nurse notified

## 2016-02-05 NOTE — Progress Notes (Signed)
Congested breath sounds.  Orally suctioned with a moderate amount of thick sputum returned.  Breath sounds clear post.  SaO2 100% 4L [place] then weaned to RA, maintaining SaO2 of 100%.

## 2016-02-05 NOTE — Progress Notes (Addendum)
Gene [name] NAME: Gene [name]    MR#:  [id number]  DATE OF BIRTH:  [birth year]  SUBJECTIVE:   Patient is alert and moans but does not answer questions. Light pressure is still on the lower side. He has received 2 units PRBCs. He has had less than 100 cc of urine output since admission  REVIEW OF SYSTEMS:    Review of Systems  Unable to perform ROS: Critical illness    Tolerating Diet: npo      DRUG ALLERGIES:  No Known Allergies  VITALS:  Blood pressure (!) 85/61, pulse 97, temperature 97.6 F (36.4 C), temperature source Other (Comment), resp. rate (!) 23, height '5\' 7"'$  (1.702 m), weight 48 kg (105 lb 13.1 oz), SpO2 94 %.  PHYSICAL EXAMINATION:   Physical Exam  Constitutional: He is well-developed, well-nourished, and in no distress. No distress.  Frail cachectic  HENT:  Head: Normocephalic.  Eyes: No scleral icterus.  Neck: Normal range of motion. Neck supple. No JVD present. No tracheal deviation present.  Cardiovascular: Exam reveals no gallop and no friction rub.   Murmur heard. Tachycardic  Pulmonary/Chest: Effort normal. No respiratory distress. He has wheezes. He has no rales. He exhibits no tenderness.  Abdominal: Soft. Bowel sounds are normal. He exhibits no distension and no mass. There is no tenderness. There is no rebound and no guarding.  Musculoskeletal: Normal range of motion. He exhibits tenderness. He exhibits no edema or deformity.  Neurological: He is alert.  He moves all extremities and his eyes are opened but due to dementia not communicative  Skin: Skin is warm. No rash noted. No erythema.  Abrasion on the left hip and leg      LABORATORY PANEL:   CBC  Recent Labs Lab 02/05/16 0500  WBC 25.1*  HGB 9.1*  HCT 29.0*  PLT 98*   ------------------------------------------------------------------------------------------------------------------  Chemistries   Recent Labs Lab  02/08/2016 1831 02/14/2016 2051  NA 159* 158*  K 6.7* 5.6*  CL 121*  --   CO2 14*  --   GLUCOSE 409*  --   BUN 111*  --   CREATININE 4.97*  --   CALCIUM 7.0*  --   AST 66*  --   ALT 19  --   ALKPHOS 77  --   BILITOT 0.9  --    ------------------------------------------------------------------------------------------------------------------  Cardiac Enzymes  Recent Labs Lab 01/22/2016 1831  TROPONINI 0.03*   ------------------------------------------------------------------------------------------------------------------  RADIOLOGY:  Dg Chest Port 1 View  Result Date: 01/30/2016 CLINICAL DATA:  Found unresponsive. EXAM: PORTABLE CHEST 1 VIEW COMPARISON:  09/05/2015 FINDINGS: Lungs are hyperexpanded as before. The left upper lobe neoplasm has progressed in the interval with complete opacification of the left upper lung on the current study. Right lung is clear. Heart size stable. The visualized bony structures of the thorax are intact. Multiple old posterior left rib fractures noted. Telemetry leads overlie the chest. IMPRESSION: Interval progression of known left upper lobe lung mass. Emphysema. Electronically Signed   By: Misty Stanley M.D.   On: 02/03/2016 19:04     ASSESSMENT AND PLAN:     83 year old male with Alzheimer's dementia and protein calorie malnutrition who presents with sepsis.  1. Severe septic shock with hypoglycemia, hypotension, hypothermia and leukocytosis due to UTI Continue broad-spectrum antibiotics with cefepime and vancomycin for now Follow-up on final cultures POA does not want pressors or aggressive measures, but does want to continue with current care Continue IVF  and monitor of BP   2. Acute Anemia with guaiac negative stools as per ER physician:  Status post 2 units PRBCs CBC for a.m. Continue PPI  3. Severe hypernatremia due to poor by mouth intake and dementia Continue D5W and monitor sodium levels every 6 hours.  4. Hyperkalemia:  BMP pending for a.m. 5. Hypoglycemia due to sepsis and poor by mouth intake: Improved with D5W  Continue POCT q 2 h  6. Acute kidney injury due to poor by mouth intake with anuria and septic shock Discontinue nephrotoxic agents including triamterene/HCTZ BMP pending for today  7. Alzheimer's dementia on donepezil    Management plans discussed with the patient's POA and she is in agreement.  CODE STATUS: DNR  Critical care TOTAL TIME TAKING CARE OF THIS PATIENT: 30 minutes.   Discussed again this am with POA, she would like to continue with current treatment. She is agreeable for palliative care consult.  POSSIBLE D/C ??, DEPENDING ON CLINICAL CONDITION.   Gene [name] M.D on 02/05/2016 at 6:37 AM  Between 7am to 6pm - Pager - [phone] After 6pm go to [email or web address] - password [email or web address] [facility]  Office  [phone]  CC: Primary care physician; Gene [place], MD  Note: This dictation was prepared with Dragon dictation along with smaller phrase technology. Any transcriptional errors that result from this process are unintentional.

## 2016-02-05 NOTE — ED Notes (Signed)
Pt shouting he "needs to pee", pt comforted and told he has a catheter that will collect his urine. Pt talking in unrecognizable speech and trying to pull off oxygen. This nurse told pt he needs the oxygen that is being given to him through his nose. Pt stopped trying to pull off oxygen at this time.
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
## 2016-02-05 NOTE — ED Notes (Signed)
Pt had another large, firm BM. Pt changed and peri care given.

## 2016-02-05 NOTE — ED Notes (Signed)
After checking twice, will continue to check blood sugar on pt's right fingers. Pt is currently getting dextrose 5%-0.9% NS in left arm. Blood sugars are WNL using right fingers. Pt is currently vocal and stating he needs to pee, pt told he has a urinary catheter and blanket readjusted. Pt lying on right side at this time in NAD.

## 2016-02-05 NOTE — Evaluation (Signed)
Clinical/Bedside Swallow Evaluation Patient Details  Name: Gene [name] MRN: [id number] Date of Birth: 03-19-1927  Today's Date: 02/05/2016 Time: SLP Start Time (ACUTE ONLY): 0940 SLP Stop Time (ACUTE ONLY): 1040 SLP Time Calculation (min) (ACUTE ONLY): 60 min  Past Medical History:  Past Medical History:  Diagnosis Date  . AD (Alzheimer's disease) 10/22/2013  . Amnesia 10/22/2013  . Anxiety   . Cardiomyopathy ([place]) 10/01/2014   Past Surgical History: History reviewed. No pertinent surgical history. HPI:  Pt is a 83 y.o. male with a known history of Alzheimer's dementia who is brought from nursing facility due to unresponsive state and found to have hypotension with systolic blood pressure initially of 40, hypothermic and hypoglycemic. Per EMS report patient has been unresponsive at the nursing facility for over 2 hours. When he arrived to the emergency room his blood sugar was 40 this is treated with glucose. His systolic blood pressure was 40 and he was given half a liter of fluids by EMS and systolic this was then 60. His temperature was 88. Pt is receiving treatments; currently has a sitter d/t agitation and is wearing mitts. He is severely confused only giving phonations though he has spoken a few words to [place]. He is NPO currently.    Assessment / Plan / Recommendation Clinical Impression  Pt appears to present w/ increased risk for aspiration at this time and is recommended to remain NPO d/t the high risk for aspiration and the sequelae of aspiration including pneumonia. Pt exhibited poor oral awareness for bolus as well as poor attention to task for oral intake. Pt appeared to demonstrate overt s/s of aspiration w/ trial of thickened liquid as well as oral holding of puree trial which was removed from his mouth. NPO recommendation was discussed w/ NSG as well as need for frequent oral care as able while NPO. ST services will f/u tomorrow w/ ongoing assessment to determine appropriateness  for an oral diet.     Aspiration Risk  Severe aspiration risk;Risk for inadequate nutrition/hydration    Diet Recommendation  NPO w/ frequent oral care for hygiene and oral stimulation; aspiration precautions  Medication Administration: Via alternative means    Other  Recommendations Recommended Consults:  (Palliative Care consult pending) Oral Care Recommendations: Oral care QID;Staff/trained caregiver to provide oral care   Follow up Recommendations  (TBD)      Frequency and Duration min 3x week  2 weeks       Prognosis Prognosis for Safe Diet Advancement: Guarded Barriers to Reach Goals: Cognitive deficits;Severity of deficits      Swallow Study   General Date of Onset: 01/19/2016 HPI: Pt is a 83 y.o. male with a known history of Alzheimer's dementia who is brought from nursing facility due to unresponsive state and found to have hypotension with systolic blood pressure initially of 40, hypothermic and hypoglycemic. Per EMS report patient has been unresponsive at the nursing facility for over 2 hours. When he arrived to the emergency room his blood sugar was 40 this is treated with glucose. His systolic blood pressure was 40 and he was given half a liter of fluids by EMS and systolic this was then 60. His temperature was 88. Pt is receiving treatments; currently has a sitter d/t agitation and is wearing mitts. He is severely confused only giving phonations though he has spoken a few words to [place]. He is NPO currently.  Type of Study: Bedside Swallow Evaluation Previous Swallow Assessment: none indicated - he was eating some  birthday cake at his Group Home ~1 week ago per report Diet Prior to this Study: NPO (currently) Temperature Spikes Noted: No (wbc 25.1) Respiratory Status: Nasal cannula (6 liters) History of Recent Intubation: No Behavior/Cognition: Confused;Agitated;Impulsive;Uncooperative;Distractible;Requires cueing;Lethargic/Drowsy Oral Cavity Assessment: Dry  (appearing) Oral Care Completed by SLP: No (d/t Cognitive status/confusion) Oral Cavity - Dentition: Edentulous Vision:  (n/a) Self-Feeding Abilities: Total assist Patient Positioning: Upright in bed Baseline Vocal Quality:  (nonverbal; moaning) Volitional Cough: Cognitively unable to elicit Volitional Swallow: Unable to elicit    Oral/Motor/Sensory Function Overall Oral Motor/Sensory Function:  (unable to assess d/t Cognitive status)   Ice Chips Ice chips: Not tested   Thin Liquid Thin Liquid: Not tested    Nectar Thick Nectar Thick Liquid: Impaired Presentation: Spoon;Straw (pinched straw; 3 trials total) Oral Phase Impairments: Poor awareness of bolus (moderate-severe) Oral phase functional implications: Prolonged oral transit;Oral residue;Oral holding Pharyngeal Phase Impairments: Suspected delayed Swallow;Throat Clearing - Delayed   Honey Thick Honey Thick Liquid: Not tested   Puree Puree: Impaired Presentation: Spoon (fed; 1 trial) Oral Phase Impairments: Poor awareness of bolus (moderate-severe) Oral Phase Functional Implications: Oral residue;Oral holding Pharyngeal Phase Impairments:  (removed from mouth)   Solid   GO   Solid: Not tested         Orinda Kenner, MS, CCC-SLP Watson,Katherine 02/05/2016,2:20 PM

## 2016-02-05 NOTE — ED Notes (Signed)
CT arrived to get pt however blood had just started. Pt will need 15 min close monitoring at this time, pt unable to go to CT at this time. CT states they will try again later.

## 2016-02-05 NOTE — Progress Notes (Signed)
Patient ID: Gene [name], male   DOB: 09/12/1927, 83 y.o.   MRN: [id number] Called by nurse regarding patient decompensating with hypotension, hypoxia and hypoglycemia.    BP back up after fluid bolus.  BS to be rechecked following amp D50. O2 sats improved with NRB, however low dose IV Haldol given secondary to agitation, patient would not keep NRB on.    I discussed the recent events with POA [name].  She affirmed that patient is DNR/DNI but would like [place] to continue to treat with fluids, O2, dextrose and Abx.  We discussed the possibility of comfort care measures.  She will think about it and I will inform her if he decompensates further.

## 2016-02-05 NOTE — ED Notes (Addendum)
Per MD, pt "does not need head CT", order discontinued. CT notified.

## 2016-02-05 NOTE — ED Notes (Signed)
IV Dextrose 5%-0.9% Sodium chloride given to this nurse by charge RN.

## 2016-02-05 NOTE — Consult Note (Signed)
[facility] Nurse wound consult note Reason for Consult: Dermatitis to bilateral hips from incontinence and wearing adult briefs.  Wound type:moisture Pressure Injury POA: Yes Measurement: Left hip:  3 cm x 8 cm darkened lesion with peeling epithelium Right hip 1 cm x 1 cm intact darkened lesion Wound BSW:[id number] Drainage (amount, consistency, odor) none Periwound:intact Dressing procedure/placement/frequency:Cleanse bilateral hips with soap and water and apply barrier cream daily.  Avoid use of adult briefs while in bed.  Contact with dermatherapy linen will improve the skin's microclimate.   Will not follow at this time.  Please re-consult if needed.  Domenic Moras RN BSN [place] Pager [phone]

## 2016-02-05 NOTE — ED Notes (Signed)
Mitts applied to patient's hands d/t patient pulling at wires and tubing.

## 2016-02-05 NOTE — ED Notes (Signed)
Pt currently resting, in NAD at this time. BP cuff moved to pt's right leg.

## 2016-02-05 NOTE — Progress Notes (Addendum)
Pharmacy Antibiotic Note  Gene [name] is a 83 y.o. male admitted on 01/26/2016 with sepsis.  Pharmacy has been consulted for cefepime, Zosyn, and vancomycin dosing.  Plan: 1. Cefepime 1 gm IV Q24H - will continue to adjust based on renal function 3. Random vanc level resulted at 12. Will dose vancomycin '500mg'$  q 48 hours. Next dose now. Will continue to monitor pt for changes in renal function/plans for renal replacement  Height: '5\' 7"'$  (170.2 cm) Weight: 105 lb 12.8 oz (48 kg) IBW/kg (Calculated) : 66.1  Temp (24hrs), Avg:96.9 F (36.1 C), Min:90 F (32.2 C), Max:98.1 F (36.7 C)   Recent Labs Lab 02/16/2016 1831 01/20/2016 2008 02/12/2016 2125 02/05/16 0500 02/05/16 1204  WBC 24.0* 27.8*  --  25.1*  --   CREATININE 4.97*  --   --  4.09*  --   LATICACIDVEN 10.5*  --  3.0*  --   --   VANCORANDOM  --   --   --   --  12    Estimated Creatinine Clearance: 8.6 mL/min (by C-G formula based on SCr of 4.09 mg/dL (H)).    No Known Allergies  Thank you for allowing pharmacy to be a part of this patient's care.  Ramond Dial, Pharm.D., BCPS Clinical Pharmacist 02/05/2016 2:34 PM

## 2016-02-05 NOTE — ED Notes (Signed)
Blood rate/dose change verified with [place] Miners, RN.

## 2016-02-05 NOTE — ED Notes (Signed)
Called pharmacy for Dextrose 5%-0.9% sodium chloride infusion, pharmacy states they will tube it to this ED.

## 2016-02-05 NOTE — Progress Notes (Signed)
Pt turned and repositioned to L side and covered with blankets.  Resting comfortably at this time.

## 2016-02-05 NOTE — Progress Notes (Signed)
Palliative Medicine consult noted. Due to high referral volume, there may be a delay seeing this patient. Please call the Palliative Medicine Team office at [phone] if recommendations are needed in the interim.  Thank you for inviting [place] to see this patient.  Marjie Skiff Shaan Rhoads, RN, BSN, [facility] 02/05/2016 8:34 AM Cell [phone] 8:00-4:00 Monday-Friday Office [phone]

## 2016-02-06 ENCOUNTER — Encounter: Payer: Self-pay | Admitting: *Deleted

## 2016-02-06 DIAGNOSIS — E162 Hypoglycemia, unspecified: Secondary | ICD-10-CM

## 2016-02-06 DIAGNOSIS — I959 Hypotension, unspecified: Secondary | ICD-10-CM

## 2016-02-06 LAB — TYPE AND SCREEN
ABO/RH(D): A POS
Antibody Screen: NEGATIVE
Unit division: 0
Unit division: 0

## 2016-02-06 LAB — GLUCOSE, CAPILLARY
GLUCOSE-CAPILLARY: 107 mg/dL — AB (ref 65–99)
GLUCOSE-CAPILLARY: 121 mg/dL — AB (ref 65–99)
GLUCOSE-CAPILLARY: 122 mg/dL — AB (ref 65–99)
GLUCOSE-CAPILLARY: 316 mg/dL — AB (ref 65–99)
GLUCOSE-CAPILLARY: 83 mg/dL (ref 65–99)
Glucose-Capillary: <10 mg/dL — CL (ref 65–99)

## 2016-02-06 LAB — BASIC METABOLIC PANEL
BUN: 105 mg/dL — ABNORMAL HIGH (ref 6–20)
BUN: 111 mg/dL — ABNORMAL HIGH (ref 6–20)
CO2: 12 mmol/L — ABNORMAL LOW (ref 22–32)
CO2: 14 mmol/L — AB (ref 22–32)
CREATININE: 4.74 mg/dL — AB (ref 0.61–1.24)
Calcium: 6.6 mg/dL — ABNORMAL LOW (ref 8.9–10.3)
Calcium: 6.7 mg/dL — ABNORMAL LOW (ref 8.9–10.3)
Creatinine, Ser: 4.59 mg/dL — ABNORMAL HIGH (ref 0.61–1.24)
GFR calc Af Amer: 12 mL/min — ABNORMAL LOW (ref >60)
GFR calc non Af Amer: 10 mL/min — ABNORMAL LOW (ref >60)
GFR calc non Af Amer: 10 mL/min — ABNORMAL LOW (ref >60)
GFR, EST AFRICAN AMERICAN: 12 mL/min — AB (ref >60)
GLUCOSE: 151 mg/dL — AB (ref 65–99)
Glucose, Bld: 117 mg/dL — ABNORMAL HIGH (ref 65–99)
POTASSIUM: 3.4 mmol/L — AB (ref 3.5–5.1)
Potassium: 4.6 mmol/L (ref 3.5–5.1)
SODIUM: 163 mmol/L — AB (ref 135–145)
Sodium: 161 mmol/L (ref 135–145)

## 2016-02-06 LAB — CBC
HEMATOCRIT: 29.1 % — AB (ref 40.0–52.0)
HEMOGLOBIN: 9 g/dL — AB (ref 13.0–18.0)
MCH: 22.1 pg — ABNORMAL LOW (ref 26.0–34.0)
MCHC: 30.9 g/dL — ABNORMAL LOW (ref 32.0–36.0)
MCV: 71.6 fL — AB (ref 80.0–100.0)
Platelets: 72 10*3/uL — ABNORMAL LOW (ref 150–440)
RBC: 4.06 MIL/uL — AB (ref 4.40–5.90)
RDW: 24.5 % — ABNORMAL HIGH (ref 11.5–14.5)
WBC: 27.3 10*3/uL — AB (ref 3.8–10.6)

## 2016-02-06 LAB — SODIUM
Sodium: 161 mmol/L (ref 135–145)
Sodium: 159 mmol/L — ABNORMAL HIGH (ref 135–145)

## 2016-02-06 LAB — URINE CULTURE: Culture: NO GROWTH

## 2016-02-06 MED ORDER — SODIUM CHLORIDE 0.9 % IV BOLUS (SEPSIS)
500.0000 mL | Freq: Once | INTRAVENOUS | Status: AC
  Administered 2016-02-06: 500 mL via INTRAVENOUS

## 2016-02-06 MED ORDER — DEXTROSE-NACL 5-0.45 % IV SOLN: INTRAVENOUS | Status: DC

## 2016-02-06 MED ORDER — SODIUM CHLORIDE 0.45 % IV SOLN
INTRAVENOUS | Status: DC
  Administered 2016-02-06: 01:00:00 via INTRAVENOUS

## 2016-02-06 MED ORDER — SODIUM CHLORIDE 0.9 % IV BOLUS (SEPSIS)
500.0000 mL | Freq: Once | INTRAVENOUS | Status: AC
  Administered 2016-02-06: 13:00:00 500 mL via INTRAVENOUS

## 2016-02-06 MED ORDER — POTASSIUM CL IN DEXTROSE 5% 20 MEQ/L IV SOLN
20.0000 meq | INTRAVENOUS | Status: DC
  Filled 2016-02-06 (×2): qty 1000

## 2016-02-06 MED ORDER — DEXTROSE 5 % IV SOLN
INTRAVENOUS | Status: DC
  Administered 2016-02-06 – 2016-02-08 (×5): via INTRAVENOUS

## 2016-02-06 NOTE — Progress Notes (Signed)
Sainani (prime doc) returned page and notified of bp of 85/51. No new orders given at this time. Madlyn Frankel, RN

## 2016-02-06 NOTE — Progress Notes (Signed)
Patient became hypoglycemic, hypotensive, and hypoxic this shift, MD notified, D50 x4 IV push given per MD, with little improvement,  D10 at 100 ml/hr ordered per MD, Blood sugar improved. 561m  bolus given per MD for hypotension, and non-rebreather applied for hypoxic with improvement. Critical lab results, sodium 163 and chloride >130, MD notified, 1/2 NS at 1037mhr started per MD order. Patient last VSS, O2 stable, BS stable, will continue to monitor.

## 2016-02-06 NOTE — Progress Notes (Signed)
Paged prime doc to notify of bp of 85/51 (no response to previous boluses); awaiting returning of page. Madlyn Frankel, RN

## 2016-02-06 NOTE — Progress Notes (Signed)
Nutrition Follow-up  DOCUMENTATION CODES:   Severe malnutrition in context of chronic illness, Underweight  INTERVENTION:  -Await diet progression; once diet advanced, will cater to preferences and offer nutritional supplements  NUTRITION DIAGNOSIS:   Malnutrition related to chronic illness as evidenced by severe depletion of body fat, severe depletion of muscle mass.  GOAL:   Provide needs based on ASPEN/SCCM guidelines  MONITOR:   Labs, Weight trends, Diet advancement, PO intake  REASON FOR ASSESSMENT:   Other (Comment) (underweight)    ASSESSMENT:   83 yo male admitted with severe septic shock with hypoglycemia, hypotension, hypothermia, severe hypernatremia and ARF due to poor by mouth intake. Pt with advanced dementia  Per MD notes, overall prognosis poor. Not a candidate for dialysis. D5 started for free water deficit of 3.6 L. Antibiotics for sepsis.   Pt remains NPO, SLP following. Mental status does not allow for safe diet advancement at this time  Labs: sodium 159, potassium 3.4, Cl >130, BUN 105, Creatinine 4.74, hypoglycemic episodes Meds: D5 at 100 ml/hr, ss novolog  Nutrition-Focused physical exam completed. Findings are severe fat depletion, severe muscle depletion, and no edema.    Diet Order:  Diet NPO time specified  Skin:  Wound (see comment) (stage II sacrum)  Last BM:  02/06/16  Height:   Ht Readings from Last 1 Encounters:  02/05/16 '5\' 7"'$  (1.702 m)    Weight:   Wt Readings from Last 1 Encounters:  02/05/16 105 lb 12.8 oz (48 kg)    BMI:  Body mass index is 16.57 kg/m.  Estimated Nutritional Needs:   Kcal:  1600-1900 kcals  Protein:  80-95 g   Fluid:  >/= 1.6 L  EDUCATION NEEDS:   No education needs identified at this time  [place], [place], [place] [phone] Pager  [phone] Weekend/On-Call Pager

## 2016-02-06 NOTE — Progress Notes (Signed)
Daily Progress Note   Patient Name: Gene [name]       Date: 02/06/2016 DOB: May 04, 1927  Age: 83 y.o. MRN#: [id number] Attending Physician: Demetrios Loll, MD Primary Care Physician: Cletis Athens, MD Admit Date: 01/27/2016  Reason for Consultation/Follow-up: Establishing goals of care  Subjective: Patient is alert but disoriented and does not follow commands. Occasional moans.  Met with POA (Ms. Ronny Flurry) at bedside. Again discussed poor prognosis including worsening WBC count, kidney functions, severe hypernatremia, need for multiple fluid boluses due to hypotension, and hypoglycemia. Discussed multiorgan failure and many interventions that are "keeping him alive." Ms. Ronny Flurry has spoken with many doctors via telephone regarding poor prognosis, which I think she is struggling to fully understand. She is concerned about the possible mass in his lungs but told her at this point, he is critically ill and unstable to pursue further options regarding this mass. She asks me what else she needs to do. I strongly encouraged her to consider transition to comfort measures where the focus will be on symptom management and allow him to die a natural death. I spoke of my concern with him not surviving this hospitalization. She becomes overwhelmed during the conversation and leaves the room to "go get a coffee." She wishes to discuss this with staff at the group home who know him. Offered emotional support and answered questions. Informed her that I am not at [facility] over the weekend but attending will be in contact with her.   Length of Stay: 2  Current Medications: Scheduled Meds:  . aspirin EC  81 mg Oral Daily  . ceFEPIme  1 g Intravenous QHS  . chlorhexidine  15 mL Mouth Rinse BID  . donepezil  10 mg Oral QHS    . escitalopram  20 mg Oral Daily  . fluticasone furoate-vilanterol  1 puff Inhalation Daily  . insulin aspart  0-9 Units Subcutaneous TID WC  . mouth rinse  15 mL Mouth Rinse q12n4p  . pantoprazole (PROTONIX) IV  40 mg Intravenous Q12H  . tamsulosin  0.4 mg Oral Daily  . vancomycin  500 mg Intravenous Q48H    Continuous Infusions: . dextrose 100 mL/hr at 02/06/16 1107    PRN Meds: acetaminophen **OR** acetaminophen, bisacodyl, HYDROcodone-acetaminophen, ondansetron **OR** ondansetron (ZOFRAN) IV, senna-docusate  Physical Exam  Constitutional: He appears cachectic. He appears  ill.  HENT:  Head: Normocephalic and atraumatic.  Cardiovascular: Regular rhythm and normal heart sounds.   Pulmonary/Chest: No tachypnea. No respiratory distress. He has decreased breath sounds.  Abdominal: Normal appearance.  Neurological: He is alert. He is disoriented.  Skin: Skin is dry.  Psychiatric: Cognition and memory are impaired. He is noncommunicative. He is inattentive.  Nursing note and vitals reviewed.          Vital Signs: BP (!) 86/57 (BP Location: Right Arm)   Pulse 88   Temp 97.9 F (36.6 C) (Oral)   Resp (!) 24   Ht 5' 7"  (1.702 m)   Wt 48 kg (105 lb 12.8 oz)   SpO2 98%   BMI 16.57 kg/m  SpO2: SpO2: 98 % O2 Device: O2 Device: Not Delivered O2 Flow Rate: O2 Flow Rate (L/min): 0 L/min  Intake/output summary:   Intake/Output Summary (Last 24 hours) at 02/06/16 1729 Last data filed at 02/06/16 1400  Gross per 24 hour  Intake                0 ml  Output              725 ml  Net             -725 ml   LBM: Last BM Date: 02/06/16 Baseline Weight: Weight: 48 kg (105 lb 13.1 oz) Most recent weight: Weight: 48 kg (105 lb 12.8 oz)       Palliative Assessment/Data: PPS 10%   Flowsheet Rows   Flowsheet Row Most Recent Value  Intake Tab  Referral Department  Hospitalist  Unit at Time of Referral  Oncology Unit  Palliative Care Primary Diagnosis  Sepsis/Infectious Disease   Date Notified  02/05/16  Palliative Care Type  New Palliative care  Reason for referral  Clarify Goals of Care  Date of Admission  02/11/2016  Date first seen by Palliative Care  02/05/16  # of days Palliative referral response time  0 Day(s)  # of days IP prior to Palliative referral  1  Clinical Assessment  Palliative Performance Scale Score  10%  Psychosocial & Spiritual Assessment  Palliative Care Outcomes  Patient/Family meeting held?  Yes  Who was at the meeting?  POA at bedside  Palliative Care Outcomes  Clarified goals of care, Provided end of life care assistance, Provided psychosocial or spiritual support      Patient Active Problem List   Diagnosis Date Noted  . Alzheimer's dementia without behavioral disturbance   . Acute renal failure ([place])   . Palliative care by specialist   . Goals of care, counseling/discussion   . Sepsis ([place]) 01/21/2016  . Cardiomyopathy ([place]) 10/01/2014  . AD (Alzheimer's disease) 10/22/2013  . Amnesia 10/22/2013    Palliative Care Assessment & Plan   Patient Profile: 83 y.o. male  with past medical history of cardiomyopathy, anxiety, amnesia, and Alzheimer's disease admitted on 02/01/2016 unresponsive from group home. Initially hypotensive with SBP in the 40's with hypothermia and hypoglycemia of 40. Severe septic shock with leukocytosis of unclear etiology. Blood cultures pending. Started on IVF and broad-spectrum antibiotics with cefepime and vancomycin. POA does not want pressors. Also with anemia requiring 2 units PRBC. Severe hypernatremia due to poor by mouth intake and dementia-receiving D5NS@75 . Acute kidney injury with anuria. Patient is critically ill with poor prognosis. Palliative medicine consultation for goals of care/poor prognosis.  Assessment: Altered mental status Dementia Severe sepsis Hypotension Hypoglycemia Acute kidney injury Severe hypernatremia Acute anemia Malnutrition  Recommendations/Plan:  DNR/DNI. No  pressors.  Spoke with POA at bedside-continue current interventions.   Discussed with Dr. Bridgett Larsson.   She will need continued conversations regarding poor prognosis and transition to comfort measures.  PMT not at [facility] over the weekend but will f/u next week if patient survives.   Code Status: DNR/DNI   Code Status Orders        Start     Ordered   02/05/16 0038  Do not attempt resuscitation (DNR)  Continuous    Question Answer Comment  In the event of cardiac or respiratory ARREST Do not call a "code blue"   In the event of cardiac or respiratory ARREST Do not perform Intubation, CPR, defibrillation or ACLS   In the event of cardiac or respiratory ARREST Use medication by any route, position, wound care, and other measures to relive pain and suffering. May use oxygen, suction and manual treatment of airway obstruction as needed for comfort.      02/05/16 0037    Code Status History    Date Active Date Inactive Code Status Order ID Comments User Context   This patient has a current code status but no historical code status.    Advance Directive Documentation   Flowsheet Row Most Recent Value  Type of Advance Directive  Healthcare Power of Attorney  Pre-existing out of facility DNR order (yellow form or pink MOST form)  No data  "MOST" Form in Place?  No data       Prognosis:   Unable to determine poor prognosis-likely days with continued episodes of hypotension and hypoglycemia.   Discharge Planning:  To Be Determined  Care plan was discussed with POA, RN, and Dr. Bridgett Larsson  Thank you for allowing the Palliative Medicine Team to assist in the care of this patient.   Time In: 1350 Time Out: 1425 Total Time 52mn Prolonged Time Billed  no       Greater than 50%  of this time was spent counseling and coordinating care related to the above assessment and plan.  MIhor Dow FNP-C Palliative Medicine Team  Phone: [phone]: [phone] Please contact Palliative  Medicine Team phone at [phone] questions and concerns.

## 2016-02-06 NOTE — Progress Notes (Signed)
[place] at [place] NAME: Gene [name]    MR#:  [id number]  DATE OF BIRTH:  31-Aug-1927  SUBJECTIVE:  CHIEF COMPLAINT:   Chief Complaint  Patient presents with  . Altered Mental Status  . Hypoglycemia   The patient is unresponsive. REVIEW OF SYSTEMS:  Review of Systems  Unable to perform ROS: Mental acuity    DRUG ALLERGIES:  No Known Allergies VITALS:  Blood pressure (!) 86/51, pulse 87, temperature 98.2 F (36.8 C), temperature source Oral, resp. rate (!) 24, height '5\' 7"'$  (1.702 m), weight 105 lb 12.8 oz (48 kg), SpO2 100 %. PHYSICAL EXAMINATION:  Physical Exam  Constitutional: No distress.  Severe malnutrition  HENT:  Dry oral mucosa  Eyes: Conjunctivae and EOM are normal. Pupils are equal, round, and reactive to light.  Neck: Neck supple. No JVD present.  Cardiovascular: Normal rate, regular rhythm and normal heart sounds.   No murmur heard. Pulmonary/Chest: Effort normal and breath sounds normal. No respiratory distress. He has no wheezes. He has no rales.  Abdominal: Soft. Bowel sounds are normal. He exhibits no distension. There is no tenderness.  Musculoskeletal: He exhibits no edema.  Neurological:  Unable to exam.  Skin: Skin is dry.  Poor turgor  Psychiatric:  Unresponsive   LABORATORY PANEL:   CBC  Recent Labs Lab 02/06/16 0257  WBC 27.3*  HGB 9.0*  HCT 29.1*  PLT 72*   ------------------------------------------------------------------------------------------------------------------ Chemistries   Recent Labs Lab 01/26/2016 1831  02/06/16 0257 02/06/16 0838  NA 159*  < > 161*  161* 159*  K 6.7*  < > 3.4*  --   CL 121*  < > >130*  --   CO2 14*  < > 14*  --   GLUCOSE 409*  < > 117*  --   BUN 111*  < > 105*  --   CREATININE 4.97*  < > 4.74*  --   CALCIUM 7.0*  < > 6.6*  --   AST 66*  --   --   --   ALT 19  --   --   --   ALKPHOS 77  --   --   --   BILITOT 0.9  --   --   --   < > =  values in this interval not displayed. RADIOLOGY:  No results found. ASSESSMENT AND PLAN:   83 year old male with Alzheimer's dementia and protein calorie malnutrition who presents with sepsis.  1. Severe septic shocks Due to UTI. Continue broad-spectrum antibiotics with cefepime and vancomycin.  BP is still low at 85/51, give NS bolus. Follow-up CBC and final cultures POA does not want pressors Continue IVF and monitor of BP  2. Acute Anemia with guaiac negative stools as per ER physician: POA would like blood transfusion. S/p PRBC transfusion, Hb is up to 9.0 (from 4.8).  3. Severe hypernatremia due to poor by mouth intake and dementia Continue D5W, Follow sodium levels.  4. Hyperkalemia: Treated in the emergency room, improved.  5. Hypoglycemia due to sepsis and poor by mouth intake: Continue D5W.  6. Acute kidney injury due to poor by mouth intake with anuria Discontinued nephrotoxic agents including triamterene/HCTZ Continue D5W. F/u BMP.  7. Alzheimer's dementia on donepezil  Lactic acidosis. Follow-up lactic acid level, continue IV fluid support and antibiotics.  Severe protein calorie malnutrition. Dietitian consult once the patient is better.  Patient is very critically ill and poor prognosis.  I called the  power of attorney Gene [name], [place] [phone] to discuss comfort care/hospice care. Nobody answered the phone.  All the records are reviewed and case discussed with RN, Care Management/Social Worker.  CODE STATUS: DNR   TOTAL CRITICAL TIME TAKING CARE OF THIS PATIENT: 46 minutes.   More than 50% of the time was spent in counseling/coordination of care: YES  POSSIBLE D/C IN >3 DAYS, DEPENDING ON CLINICAL CONDITION.   Gene [name] M.D on 02/06/2016 at 2:12 PM  Between 7am to 6pm - Pager - [phone]  After 6pm go to [email or web address] - Proofreader  [facility]  Office  [phone]  CC: Primary care physician;  Gene Athens, MD  Note: This dictation was prepared with Dragon dictation along with smaller phrase technology. Any transcriptional errors that result from this process are unintentional.

## 2016-02-06 NOTE — Consult Note (Signed)
[facility]  CONSULT NOTE    Date: 02/06/2016                  Patient Name:  Gene [name]  MRN: [id number]  DOB: 03/10/1927  Age / Sex: 83 y.o., male         PCP: Cletis Athens, MD                 Service Requesting Consult: Dr. Benjie Karvonen                 Reason for Consult: Acute Renal Failure            History of Present Illness: Gene [name] is a 83 y.o. black male with Alzheimer's dementia, congestive heart failure, hypertension, who was admitted to [facility] on 02/12/2016 for Hyperkalemia [E87.5] Hypoglycemia [E16.2] Unresponsiveness [R41.89] Hypotension, unspecified hypotension type [I95.9] Acute renal failure, unspecified acute renal failure type ([place]) [N17.9] Anemia, unspecified type [D64.9]   Patient was found at his SNF cachectic after being treatment for influenza.  Nephrology consulted for acute renal failure with hypernatremia, metabolic acidosis, and hypocalcemia. Baseline creatinine of 1.8, 09/19/2015.   Medications: Outpatient medications: Prescriptions Prior to Admission  Medication Sig Dispense Refill Last Dose  . aspirin EC 81 MG tablet Take by mouth.   02/01/2016 at 0800  . Calcium Carbonate-Vitamin D 600-400 MG-UNIT per tablet Take by mouth.   02/11/2016 at 0800  . donepezil (ARICEPT ODT) 10 MG disintegrating tablet Take 10 mg by mouth at bedtime.   01/23/2016 at 0800  . escitalopram (LEXAPRO) 20 MG tablet Take by mouth.   02/18/2016 at 0800  . fesoterodine (TOVIAZ) 4 MG TB24 tablet Take by mouth.   01/24/2016 at 0800  . fluticasone furoate-vilanterol (BREO ELLIPTA) 200-25 MCG/INH AEPB Inhale 1 puff into the lungs daily.   01/29/2016 at 0800  . loratadine (CLARITIN) 10 MG tablet Take 10 mg by mouth daily.   01/19/2016 at 0800  . LORazepam (ATIVAN) 0.5 MG tablet Take 0.5 mg by mouth 2 (two) times daily.    01/27/2016 at 0800  . omeprazole (PRILOSEC) 20 MG capsule Take 20 mg by mouth daily.   02/18/2016 at 0800  . tamsulosin (FLOMAX) 0.4 MG CAPS  capsule Take 0.4 mg by mouth daily.    01/23/2016 at 0800  . traMADol (ULTRAM) 50 MG tablet Take 50 mg by mouth every 6 (six) hours.   01/28/2016 at 1400  . triamterene-hydrochlorothiazide (MAXZIDE-25) 37.5-25 MG tablet Take 0.5 tablets by mouth daily.   01/27/2016 at 0800  . donepezil (ARICEPT) 10 MG tablet Take by mouth.   Taking  . levalbuterol (XOPENEX HFA) 45 MCG/ACT inhaler 2 puff by inhalation every 4 to 6 hours PRN   Not Taking at Unknown time  . memantine (NAMENDA) 5 MG tablet Take by mouth.   Taking    Current medications: Current Facility-Administered Medications  Medication Dose Route Frequency Provider Last Rate Last Dose  . acetaminophen (TYLENOL) tablet 650 mg  650 mg Oral Q6H PRN Bettey Costa, MD       Or  . acetaminophen (TYLENOL) suppository 650 mg  650 mg Rectal Q6H PRN Bettey Costa, MD   650 mg at 02/06/16 0009  . aspirin EC tablet 81 mg  81 mg Oral Daily Sital Mody, MD      . bisacodyl (DULCOLAX) EC tablet 5 mg  5 mg Oral Daily PRN Sital Mody, MD      . ceFEPIme (MAXIPIME) 1 GM / 66m IVPB  premix  1 g Intravenous QHS Bettey Costa, MD   1 g at 02/05/16 2326  . chlorhexidine (PERIDEX) 0.12 % solution 15 mL  15 mL Mouth Rinse BID Bettey Costa, MD   15 mL at 02/06/16 1110  . dextrose 5 % solution   Intravenous Continuous Lavonia Dana, MD 100 mL/hr at 02/06/16 1107    . donepezil (ARICEPT) tablet 10 mg  10 mg Oral QHS Sital Mody, MD      . escitalopram (LEXAPRO) tablet 20 mg  20 mg Oral Daily Sital Mody, MD      . fluticasone furoate-vilanterol (BREO ELLIPTA) 200-25 MCG/INH 1 puff  1 puff Inhalation Daily Sital Mody, MD      . HYDROcodone-acetaminophen (NORCO/VICODIN) 5-325 MG per tablet 1-2 tablet  1-2 tablet Oral Q4H PRN Sital Mody, MD      . insulin aspart (novoLOG) injection 0-9 Units  0-9 Units Subcutaneous TID WC Dustin Flock, MD      . MEDLINE mouth rinse  15 mL Mouth Rinse q12n4p Sital Mody, MD   15 mL at 02/06/16 1200  . ondansetron (ZOFRAN) tablet 4 mg  4 mg Oral Q6H PRN  Bettey Costa, MD       Or  . ondansetron (ZOFRAN) injection 4 mg  4 mg Intravenous Q6H PRN Sital Mody, MD      . pantoprazole (PROTONIX) injection 40 mg  40 mg Intravenous Q12H Sital Mody, MD   40 mg at 02/06/16 1101  . senna-docusate (Senokot-S) tablet 1 tablet  1 tablet Oral QHS PRN Bettey Costa, MD      . tamsulosin (FLOMAX) capsule 0.4 mg  0.4 mg Oral Daily Sital Mody, MD      . vancomycin (VANCOCIN) 500 mg in sodium chloride 0.9 % 100 mL IVPB  500 mg Intravenous Q48H Melissa D Maccia, RPH   500 mg at 02/05/16 1527      Allergies: No Known Allergies    Past Medical History: Past Medical History:  Diagnosis Date  . AD (Alzheimer's disease) 10/22/2013  . Amnesia 10/22/2013  . Anxiety   . Cardiomyopathy ([place]) 10/01/2014     Past Surgical History: History reviewed. No pertinent surgical history.   Family History: No family history on file.   Social History: Social History   Social History  . Marital status: Married    Spouse name: N/A  . Number of children: N/A  . Years of education: N/A   Occupational History  . Not on file.   Social History Main Topics  . Smoking status: Current Every Day Smoker    Packs/day: 0.25    Types: Cigarettes  . Smokeless tobacco: Not on file  . Alcohol use No  . Drug use: No  . Sexual activity: Not on file   Other Topics Concern  . Not on file   Social History Narrative  . No narrative on file     Review of Systems: Review of Systems  Unable to perform ROS: Dementia    Vital Signs: Blood pressure (!) 94/40, pulse 99, temperature 97.4 F (36.3 C), resp. rate (!) 24, height '5\' 7"'$  (1.702 m), weight 48 kg (105 lb 12.8 oz), SpO2 99 %.  Weight trends: Filed Weights   02/10/2016 1850 02/05/16 1205  Weight: 48 kg (105 lb 13.1 oz) 48 kg (105 lb 12.8 oz)    Physical Exam: General: Cachectic  Head: Adentulous, dry mucosal membranes  Eyes: Anicteric, PERRL  Neck: Supple, trachea midline  Lungs:  Clear to auscultation  Heart:  Regular rate and rhythm  Abdomen:  Soft, nontender  Extremities: No peripheral edema.  Neurologic: Nonfocal, moving all four extremities  Skin: No lesions        Lab results: Basic Metabolic Panel:  Recent Labs Lab 02/05/16 0500  02/05/16 2338 02/06/16 0257 02/06/16 0838  NA 159*  < > 163* 161*  161* 159*  K 3.7  --  4.6 3.4*  --   CL 129*  --  >130* >130*  --   CO2 18*  --  12* 14*  --   GLUCOSE 361*  --  151* 117*  --   BUN 97*  --  111* 105*  --   CREATININE 4.09*  --  4.59* 4.74*  --   CALCIUM 5.7*  --  6.7* 6.6*  --   < > = values in this interval not displayed.  Liver Function Tests:  Recent Labs Lab 02/14/2016 1831  AST 66*  ALT 19  ALKPHOS 77  BILITOT 0.9  PROT 4.7*  ALBUMIN 1.6*   No results for input(s): LIPASE, AMYLASE in the last 168 hours. No results for input(s): AMMONIA in the last 168 hours.  CBC:  Recent Labs Lab 02/01/2016 1831 02/03/2016 2008 02/05/16 0500 02/06/16 0257  WBC 24.0* 27.8* 25.1* 27.3*  NEUTROABS 23.0*  --   --   --   HGB 5.4* 4.8* 9.1* 9.0*  HCT 19.3* 19.2* 29.0* 29.1*  MCV 70.9* 81.4 71.5* 71.6*  PLT 93* 81* 98* 72*    Cardiac Enzymes:  Recent Labs Lab 02/10/2016 1831  TROPONINI 0.03*    BNP: Invalid input(s): POCBNP  CBG:  Recent Labs Lab 02/05/16 2257 02/06/16 0013 02/06/16 0607 02/06/16 0618 02/06/16 0800  GLUCAP 43* 316* <10* 83 107*    Microbiology: Results for orders placed or performed during the hospital encounter of 02/15/2016  Blood Culture (routine x 2)     Status: None (Preliminary result)   Collection Time: 02/14/2016  6:31 PM  Result Value Ref Range Status   Specimen Description BLOOD  LEFT AC  Final   Special Requests   Final    BOTTLES DRAWN AEROBIC AND ANAEROBIC  AER 11 ML ANA 8 ML   Culture NO GROWTH 2 DAYS  Final   Report Status PENDING  Incomplete  Blood Culture (routine x 2)     Status: None (Preliminary result)   Collection Time: 02/18/2016  6:31 PM  Result Value Ref Range Status    Specimen Description BLOOD  LEFT FOREARM  Final   Special Requests   Final    BOTTLES DRAWN AEROBIC AND ANAEROBIC  AER 9 ML ANA 10 ML   Culture NO GROWTH 2 DAYS  Final   Report Status PENDING  Incomplete  Urine culture     Status: None   Collection Time: 01/23/2016  8:56 PM  Result Value Ref Range Status   Specimen Description URINE, RANDOM  Final   Special Requests NONE  Final   Culture   Final    NO GROWTH Performed at [facility], [place] [address] [place], [place] [address]    Report Status 02/06/2016 FINAL  Final  MRSA PCR Screening     Status: None   Collection Time: 02/05/16  3:24 PM  Result Value Ref Range Status   MRSA by PCR NEGATIVE NEGATIVE Final    Comment:        The GeneXpert MRSA Assay (FDA approved for NASAL specimens only), is one component of a comprehensive MRSA colonization  surveillance program. It is not intended to diagnose MRSA infection nor to guide or monitor treatment for MRSA infections.     Coagulation Studies:  Recent Labs  02/18/2016 1831  LABPROT 20.9*  INR 1.78    Urinalysis:  Recent Labs  01/28/2016 2056  COLORURINE AMBER*  LABSPEC 1.016  PHURINE 5.0  GLUCOSEU 50*  HGBUR SMALL*  BILIRUBINUR NEGATIVE  KETONESUR NEGATIVE  PROTEINUR 100*  NITRITE NEGATIVE  LEUKOCYTESUR NEGATIVE      Imaging: Dg Chest Port 1 View  Result Date: 01/27/2016 CLINICAL DATA:  Found unresponsive. EXAM: PORTABLE CHEST 1 VIEW COMPARISON:  09/05/2015 FINDINGS: Lungs are hyperexpanded as before. The left upper lobe neoplasm has progressed in the interval with complete opacification of the left upper lung on the current study. Right lung is clear. Heart size stable. The visualized bony structures of the thorax are intact. Multiple old posterior left rib fractures noted. Telemetry leads overlie the chest. IMPRESSION: Interval progression of known left upper lobe lung mass. Emphysema. Electronically Signed   By: Misty Stanley M.D.   On: 02/05/2016 19:04       Assessment & Plan: Gene [name] is a 83 y.o. black male with Alzheimer's dementia, congestive heart failure, hypertension, who was admitted to [facility] on 02/07/2016 for Hyperkalemia [E87.5] Hypoglycemia [E16.2] Unresponsiveness [R41.89] Hypotension, unspecified hypotension type [I95.9] Acute renal failure, unspecified acute renal failure type ([place]) [N17.9] Anemia, unspecified type [D64.9]   1. Acute Renal Failure 2. Hypernatremia 3. Hypokalemia 4. Metabolic Acidosis 5. Hypocalcemia 6. Anemia with renal failure  Impression Overall prognosis is poor. Have discussed case with HCPOA: [name].  At this time, not a candidate for dialysis.  Free water deficit of 3.6 litres.  Start D5W - monitor electrolytes.   LOS: 2 Gene [name] 1/19/201811:41 AM

## 2016-02-06 NOTE — Progress Notes (Signed)
Speech Language Pathology Treatment: Dysphagia  Patient Details Name: Gene [name] MRN: [id number] DOB: 12-10-1927 Today's Date: 02/06/2016 Time: 1430-1500 SLP Time Calculation (min) (ACUTE ONLY): 30 min  Assessment / Plan / Recommendation Clinical Impression  Pt continues to present w/ increased risk for aspiration at this time and is recommended to remain NPO d/t the high risk for aspiration and the sequelae of aspiration including pneumonia. Pt exhibited poor oral awareness for bolus w/ reduced bolus management of Honey consistency liquid w/ anterior spillage and oral residue of such remaining post an attempted pharyngeal swallow. Pt appeared to exhibit a change in vocal quality(wetness) w/ delayed cough/throat clearing post 3 tsp trials. Pt does have reduced attention to task for oral intake and requires max verbal/tactile cues. NPO recommendation was discussed w/ NSG as well as need for frequent oral care as able while NPO. ST services will f/u tomorrow w/ ongoing assessment to determine appropriateness for an oral diet. Palliative care following pt now for POC.    HPI HPI: Pt is a 83 y.o. male with a known history of Alzheimer's dementia who is brought from nursing facility due to unresponsive state and found to have hypotension with systolic blood pressure initially of 40, hypothermic and hypoglycemic. Per EMS report patient has been unresponsive at the nursing facility for over 2 hours. When he arrived to the emergency room his blood sugar was 40 this is treated with glucose. His systolic blood pressure was 40 and he was given half a liter of fluids by EMS and systolic this was then 60. His temperature was 88. Pt is receiving treatments; currently has a sitter d/t agitation and is wearing mitts. He is severely confused only giving phonations though he has spoken a few words to [name]. He is NPO currently.       SLP Plan  Continue with current plan of care (ongoing assessment of possible po's)      Recommendations  Diet recommendations: NPO Medication Administration: Via alternative means Postural Changes and/or Swallow Maneuvers: Seated upright 90 degrees (w/ oral care)                General recommendations:  (Palletive care following) Oral Care Recommendations: Oral care QID;Staff/trained caregiver to provide oral care Follow up Recommendations:  (TBD) Plan: Continue with current plan of care (ongoing assessment of possible po's)       [place], MS, CCC-SLP Gene [name] 02/06/2016, 3:09 PM

## 2016-02-06 NOTE — Plan of Care (Signed)
Problem: SLP Dysphagia Goals Goal: Misc Dysphagia Goal Pt will safely tolerate po diet of least restrictive consistency w/ no overt s/s of aspiration noted by Staff/pt/family x3 sessions.    

## 2016-02-06 NOTE — Care Management Important Message (Signed)
Important Message  Patient Details  Name: Gene [name] MRN: [id number] Date of Birth: 1927-11-17   Medicare Important Message Given:  Yes  Spoke with Gene [name] by phone [phone]. I could not find an initial copy of IM in patient medical record. IM explained to Gene [name] as she states she is patient's sister in Acupuncturist. Copy left at patient's beside of this conversation after explaining to Gene [name].   Marshell Garfinkel, RN 02/06/2016, 8:59 AM

## 2016-02-07 DIAGNOSIS — L8990 Pressure ulcer of unspecified site, unspecified stage: Secondary | ICD-10-CM | POA: Insufficient documentation

## 2016-02-07 DIAGNOSIS — E43 Unspecified severe protein-calorie malnutrition: Secondary | ICD-10-CM | POA: Insufficient documentation

## 2016-02-07 LAB — BASIC METABOLIC PANEL
BUN: 98 mg/dL — ABNORMAL HIGH (ref 6–20)
CALCIUM: 7.1 mg/dL — AB (ref 8.9–10.3)
CO2: 16 mmol/L — AB (ref 22–32)
CREATININE: 4.43 mg/dL — AB (ref 0.61–1.24)
GFR calc Af Amer: 13 mL/min — ABNORMAL LOW (ref >60)
GFR calc non Af Amer: 11 mL/min — ABNORMAL LOW (ref >60)
GLUCOSE: 135 mg/dL — AB (ref 65–99)
Potassium: 3 mmol/L — ABNORMAL LOW (ref 3.5–5.1)
Sodium: 157 mmol/L — ABNORMAL HIGH (ref 135–145)

## 2016-02-07 LAB — GLUCOSE, CAPILLARY
GLUCOSE-CAPILLARY: 119 mg/dL — AB (ref 65–99)
GLUCOSE-CAPILLARY: 124 mg/dL — AB (ref 65–99)
GLUCOSE-CAPILLARY: 115 mg/dL — AB (ref 65–99)
Glucose-Capillary: 122 mg/dL — ABNORMAL HIGH (ref 65–99)
Glucose-Capillary: 117 mg/dL — ABNORMAL HIGH (ref 65–99)

## 2016-02-07 LAB — LACTIC ACID, PLASMA: Lactic Acid, Venous: 1.5 mmol/L (ref 0.5–1.9)

## 2016-02-07 MED ORDER — MORPHINE SULFATE (PF) 2 MG/ML IV SOLN
2.0000 mg | INTRAVENOUS | Status: DC | PRN
  Administered 2016-02-07 – 2016-02-08 (×3): 2 mg via INTRAVENOUS
  Filled 2016-02-07 (×3): qty 1

## 2016-02-07 MED ORDER — CEFTRIAXONE SODIUM-DEXTROSE 1-3.74 GM-% IV SOLR
1.0000 g | INTRAVENOUS | Status: DC
  Administered 2016-02-07: 1 g via INTRAVENOUS
  Filled 2016-02-07: qty 50

## 2016-02-07 MED ORDER — SODIUM CHLORIDE 0.9 % IV BOLUS (SEPSIS)
250.0000 mL | Freq: Once | INTRAVENOUS | Status: AC
  Administered 2016-02-07: 250 mL via INTRAVENOUS

## 2016-02-07 MED ORDER — DEXTROSE 5 % IV SOLN: 1.0000 g | INTRAVENOUS | Status: DC

## 2016-02-07 NOTE — Progress Notes (Signed)
Chart reviewed. Condition remains the same. Not appropriate for PO's at this time secondary to high risk for aspiration. Rec. continue hydration in hopes over improvement. Will reassess swallowing as overall status improves or if needed for comfort care.

## 2016-02-07 NOTE — Clinical Social Work Note (Signed)
CSW following due to potential need for hospice care and admitted from family care home.   Santiago Bumpers, MSW, Latanya Presser [phone]

## 2016-02-07 NOTE — Consult Note (Signed)
Reason for Consult:Ischemia left Arm Referring Physician: Dr. Justice Rocher [name] is an 83 y.o. male.  HPI: Presented with sepsis, unresponsive, anemia(HgB 4)  Past Medical History:  Diagnosis Date  . AD (Alzheimer's disease) 10/22/2013  . Amnesia 10/22/2013  . Anxiety   . Cardiomyopathy ([place]) 10/01/2014    History reviewed. No pertinent surgical history.  No family history on file.  Social History:  reports that he has been smoking Cigarettes.  He has been smoking about 0.25 packs per day. He does not have any smokeless tobacco history on file. He reports that he does not drink alcohol or use drugs.  Allergies: No Known Allergies  Medications: I have reviewed the patient's current medications.  Results for orders placed or performed during the hospital encounter of 01/26/2016 (from the past 48 hour(s))  Sodium     Status: Abnormal   Collection Time: 02/05/16  2:27 PM  Result Value Ref Range   Sodium 160 (H) 135 - 145 mmol/L  MRSA PCR Screening     Status: None   Collection Time: 02/05/16  3:24 PM  Result Value Ref Range   MRSA by PCR NEGATIVE NEGATIVE    Comment:        The GeneXpert MRSA Assay (FDA approved for NASAL specimens only), is one component of a comprehensive MRSA colonization surveillance program. It is not intended to diagnose MRSA infection nor to guide or monitor treatment for MRSA infections.   Glucose, capillary     Status: Abnormal   Collection Time: 02/05/16  5:19 PM  Result Value Ref Range   Glucose-Capillary 125 (H) 65 - 99 mg/dL  Sodium     Status: Abnormal   Collection Time: 02/05/16  8:39 PM  Result Value Ref Range   Sodium 160 (H) 135 - 145 mmol/L  Glucose, capillary     Status: Abnormal   Collection Time: 02/05/16  9:32 PM  Result Value Ref Range   Glucose-Capillary <10 (LL) 65 - 99 mg/dL  Glucose, capillary     Status: Abnormal   Collection Time: 02/05/16  9:35 PM  Result Value Ref Range   Glucose-Capillary <10 (LL) 65 - 99 mg/dL   Glucose, capillary     Status: Abnormal   Collection Time: 02/05/16  9:57 PM  Result Value Ref Range   Glucose-Capillary <10 (LL) 65 - 99 mg/dL  Glucose, capillary     Status: Abnormal   Collection Time: 02/05/16 10:01 PM  Result Value Ref Range   Glucose-Capillary <10 (LL) 65 - 99 mg/dL  Glucose, capillary     Status: Abnormal   Collection Time: 02/05/16 10:29 PM  Result Value Ref Range   Glucose-Capillary 53 (L) 65 - 99 mg/dL  Glucose, capillary     Status: Abnormal   Collection Time: 02/05/16 10:57 PM  Result Value Ref Range   Glucose-Capillary 43 (LL) 65 - 99 mg/dL  Basic metabolic panel     Status: Abnormal   Collection Time: 02/05/16 11:38 PM  Result Value Ref Range   Sodium 163 (HH) 135 - 145 mmol/L    Comment: CRITICAL RESULT CALLED TO, READ BACK BY AND VERIFIED WITH JACKIE PAGE AT 0057 02/06/2016 BY TFK.    Potassium 4.6 3.5 - 5.1 mmol/L   Chloride >130 (HH) 101 - 111 mmol/L    Comment: CRITICAL RESULT CALLED TO, READ BACK BY AND VERIFIED WITH JACKIE PAGE AT 1700 02/06/2016.  TFK RESULT CONFIRMED BY MANUAL DILUTION.  TFK    CO2 12 (L) 22 -  32 mmol/L   Glucose, Bld 151 (H) 65 - 99 mg/dL   BUN 111 (H) 6 - 20 mg/dL    Comment: RESULT CONFIRMED BY MANUAL DILUTION.  TFK   Creatinine, Ser 4.59 (H) 0.61 - 1.24 mg/dL   Calcium 6.7 (L) 8.9 - 10.3 mg/dL   GFR calc non Af Amer 10 (L) >60 mL/min   GFR calc Af Amer 12 (L) >60 mL/min    Comment: (NOTE) The eGFR has been calculated using the CKD EPI equation. This calculation has not been validated in all clinical situations. eGFR's persistently <60 mL/min signify possible Chronic Kidney Disease.    Anion gap NOT CALCULATED 5 - 15  Glucose, capillary     Status: Abnormal   Collection Time: 02/06/16 12:13 AM  Result Value Ref Range   Glucose-Capillary 316 (H) 65 - 99 mg/dL  Sodium     Status: Abnormal   Collection Time: 02/06/16  2:57 AM  Result Value Ref Range   Sodium 161 (HH) 135 - 145 mmol/L    Comment: CRITICAL  RESULT CALLED TO, READ BACK BY AND VERIFIED WITH JACKIE PAGE AT 1700 02/06/2016 BY TFK.   Basic metabolic panel     Status: Abnormal   Collection Time: 02/06/16  2:57 AM  Result Value Ref Range   Sodium 161 (HH) 135 - 145 mmol/L    Comment: CRITICAL RESULT CALLED TO, READ BACK BY AND VERIFIED WITH JACKIE PAGE 02/06/16 0522 [facility]    Potassium 3.4 (L) 3.5 - 5.1 mmol/L   Chloride >130 (HH) 101 - 111 mmol/L    Comment: CRITICAL RESULT CALLED TO, READ BACK BY AND VERIFIED WITH JACKIE PAGE 02/06/16 0522 [facility] RESULT CONFIRMED BY MANUAL DILUTION    CO2 14 (L) 22 - 32 mmol/L   Glucose, Bld 117 (H) 65 - 99 mg/dL   BUN 105 (H) 6 - 20 mg/dL    Comment: RESULT CONFIRMED BY MANUAL DILUTION [facility]   Creatinine, Ser 4.74 (H) 0.61 - 1.24 mg/dL   Calcium 6.6 (L) 8.9 - 10.3 mg/dL   GFR calc non Af Amer 10 (L) >60 mL/min   GFR calc Af Amer 12 (L) >60 mL/min    Comment: (NOTE) The eGFR has been calculated using the CKD EPI equation. This calculation has not been validated in all clinical situations. eGFR's persistently <60 mL/min signify possible Chronic Kidney Disease.    Anion gap NOT CALCULATED 5 - 15  CBC     Status: Abnormal   Collection Time: 02/06/16  2:57 AM  Result Value Ref Range   WBC 27.3 (H) 3.8 - 10.6 K/uL   RBC 4.06 (L) 4.40 - 5.90 MIL/uL   Hemoglobin 9.0 (L) 13.0 - 18.0 g/dL   HCT 29.1 (L) 40.0 - 52.0 %   MCV 71.6 (L) 80.0 - 100.0 fL   MCH 22.1 (L) 26.0 - 34.0 pg   MCHC 30.9 (L) 32.0 - 36.0 g/dL   RDW 24.5 (H) 11.5 - 14.5 %   Platelets 72 (L) 150 - 440 K/uL  Glucose, capillary     Status: Abnormal   Collection Time: 02/06/16  6:07 AM  Result Value Ref Range   Glucose-Capillary <10 (LL) 65 - 99 mg/dL  Glucose, capillary     Status: None   Collection Time: 02/06/16  6:18 AM  Result Value Ref Range   Glucose-Capillary 83 65 - 99 mg/dL  Glucose, capillary     Status: Abnormal   Collection Time: 02/06/16  8:00 AM  Result Value Ref  Range   Glucose-Capillary 107 (H) 65 - 99 mg/dL   Sodium     Status: Abnormal   Collection Time: 02/06/16  8:38 AM  Result Value Ref Range   Sodium 159 (H) 135 - 145 mmol/L  Glucose, capillary     Status: Abnormal   Collection Time: 02/06/16 12:26 PM  Result Value Ref Range   Glucose-Capillary 122 (H) 65 - 99 mg/dL  Glucose, capillary     Status: Abnormal   Collection Time: 02/06/16  4:35 PM  Result Value Ref Range   Glucose-Capillary 121 (H) 65 - 99 mg/dL  Glucose, capillary     Status: Abnormal   Collection Time: 02/07/16  3:24 AM  Result Value Ref Range   Glucose-Capillary 122 (H) 65 - 99 mg/dL  Basic metabolic panel     Status: Abnormal   Collection Time: 02/07/16  4:51 AM  Result Value Ref Range   Sodium 157 (H) 135 - 145 mmol/L   Potassium 3.0 (L) 3.5 - 5.1 mmol/L   Chloride >130 (HH) 101 - 111 mmol/L    Comment: CRITICAL RESULT CALLED TO, READ BACK BY AND VERIFIED WITH DOROTHY RIGUERA_0  ON 02/07/16 BY HKP    CO2 16 (L) 22 - 32 mmol/L   Glucose, Bld 135 (H) 65 - 99 mg/dL   BUN 98 (H) 6 - 20 mg/dL    Comment: RESULT CONFIRMED BY MANUAL DILUTION [facility]   Creatinine, Ser 4.43 (H) 0.61 - 1.24 mg/dL   Calcium 7.1 (L) 8.9 - 10.3 mg/dL   GFR calc non Af Amer 11 (L) >60 mL/min   GFR calc Af Amer 13 (L) >60 mL/min    Comment: (NOTE) The eGFR has been calculated using the CKD EPI equation. This calculation has not been validated in all clinical situations. eGFR's persistently <60 mL/min signify possible Chronic Kidney Disease.    Anion gap NOT CALCULATED 5 - 15  Lactic acid, plasma     Status: None   Collection Time: 02/07/16  4:51 AM  Result Value Ref Range   Lactic Acid, Venous 1.5 0.5 - 1.9 mmol/L  Glucose, capillary     Status: Abnormal   Collection Time: 02/07/16  9:55 AM  Result Value Ref Range   Glucose-Capillary 119 (H) 65 - 99 mg/dL  Glucose, capillary     Status: Abnormal   Collection Time: 02/07/16 11:21 AM  Result Value Ref Range   Glucose-Capillary 117 (H) 65 - 99 mg/dL    No results found.  Review  of Systems  Unable to perform ROS: Dementia   Blood pressure (!) 76/50, pulse 85, temperature 97.3 F (36.3 C), temperature source Oral, resp. rate (!) 24, height _1  (1.702 m), weight 48 kg (105 lb 12.8 oz), SpO2 100 %. Physical Exam  Nursing note and vitals reviewed. Constitutional: He appears cachectic. He has a sickly appearance. He appears ill.  HENT:  Head: Normocephalic and atraumatic.  Cardiovascular: Regular rhythm, S1 normal and S2 normal.   Left ARm: Subclavian pulse by doppler only, NO Axillary, brachial ,radial or ulnar pulses by doppler  Respiratory: Effort normal.  GI: Soft.  Musculoskeletal:  Left Arm: NON VIABLE EXTREMITY, gangrene, edematous, cold, blistering, NO MOTOR, No Sensory, fingers dark/dusky, no capillary refill    Assessment/Plan:  Patient with what appears to be end stage Gene [name], sepsis, anemia and Ischemia of his left arm.  Per exam and nursing history, his arm has likely been ischemic for 24-36 hours, possibly embolic or low flow state as an etiology.  Unfortunately, He has a NONviable arm, not amenable to revascularization at this point.  The patient will likely be placed in Hospice in the next day or so per family.   Gene [name] A 02/07/2016, 12:21 PM

## 2016-02-07 NOTE — Plan of Care (Signed)
Problem: Education: Goal: Knowledge of [facility] Education information/materials will improve Outcome: Not Progressing Pt shows no evidence of learning.  Problem: Safety: Goal: Ability to remain free from injury will improve Outcome: Not Progressing Pt has new skin tear to left arm  Problem: Pain Managment: Goal: General experience of comfort will improve Outcome: Not Progressing Pt seems uncomfortable, SOB. Will give morphine when available.  Problem: Physical Regulation: Goal: Ability to maintain clinical measurements within normal limits will improve Outcome: Not Progressing Pt experiencing hypotension, tachycardia, tachypnea Goal: Will remain free from infection Outcome: Progressing Pt receiving IV antibiotics  Problem: Skin Integrity: Goal: Risk for impaired skin integrity will decrease Outcome: Not Progressing Pt's left arm has no pulse, is swollen, dark and mottled.  Problem: Tissue Perfusion: Goal: Risk factors for ineffective tissue perfusion will decrease Outcome: Not Progressing Pt's left arm has no pulse.  Problem: Activity: Goal: Risk for activity intolerance will decrease Outcome: Not Progressing Pt is bed bound.  Problem: Fluid Volume: Goal: Ability to maintain a balanced intake and output will improve Outcome: Not Progressing Foley discontinued. No urine output since.  Problem: Nutrition: Goal: Adequate nutrition will be maintained Outcome: Not Progressing Pt unable to tolerate anything by mouth. Receiving D5 NS at 100 ml/hr.

## 2016-02-07 NOTE — Progress Notes (Addendum)
Patient ID: Gene [name], male   DOB: July 20, 1927, 83 y.o.   MRN: [id number]  [facility] PROGRESS NOTE  Gene [name] GBT:[id number] DOB: [birth year] DOA: 01/24/2016 PCP: Cletis Athens, MD  HPI/Subjective: Patient is alert. Unable to give me any history.  Objective: Vitals:   02/06/16 2032 02/07/16 0509  BP: (!) 135/95 (!) 85/51  Pulse: 69 (!) 112  Resp: (!) 21 (!) 24  Temp: 97.6 F (36.4 C) 98 F (36.7 C)    Filed Weights   02/18/2016 1850 02/05/16 1205  Weight: 48 kg (105 lb 13.1 oz) 48 kg (105 lb 12.8 oz)    ROS: Review of Systems  Unable to perform ROS: Dementia   Exam: Physical Exam  HENT:  Nose: No mucosal edema.  Mouth/Throat: No oropharyngeal exudate or posterior oropharyngeal edema.  Eyes: Conjunctivae and lids are normal. Pupils are equal, round, and reactive to light.  Neck: No JVD present. Carotid bruit is not present. No edema present. No thyromegaly present.  Cardiovascular: Regular rhythm, S1 normal and S2 normal.   Murmur heard.  Systolic murmur is present with a grade of 2/6  Respiratory: He has no decreased breath sounds. He has no wheezes. He has no rhonchi. He has no rales.  GI: Soft. There is no tenderness.  Musculoskeletal:       Right ankle: He exhibits no swelling.       Left ankle: He exhibits no swelling.  Neurological: He is alert.  Skin:  Numerous areas of skin breakdown and scabs seen on his knees. Patient had a bowel movement so unable to look at his sacrum at this time. Left hand gray looking in fingertips starting to turn blackish. Unable to palpate radial or ulnar pulses at the wrist.  Psychiatric:  Patient is alert. Moves his arms and legs.      Data Reviewed: Basic Metabolic Panel:  Recent Labs Lab 01/20/2016 1831 02/12/2016 2051 02/05/16 0500  02/05/16 2039 02/05/16 2338 02/06/16 0257 02/06/16 0838 02/07/16 0451  NA 159* 158* 159*  < > 160* 163* 161*  161* 159* 157*  K 6.7* 5.6* 3.7  --   --  4.6 3.4*  --  3.0*   CL 121*  --  129*  --   --  >130* >130*  --  >130*  CO2 14*  --  18*  --   --  12* 14*  --  16*  GLUCOSE 409*  --  361*  --   --  151* 117*  --  135*  BUN 111*  --  97*  --   --  111* 105*  --  98*  CREATININE 4.97*  --  4.09*  --   --  4.59* 4.74*  --  4.43*  CALCIUM 7.0*  --  5.7*  --   --  6.7* 6.6*  --  7.1*  < > = values in this interval not displayed. Liver Function Tests:  Recent Labs Lab 02/15/2016 1831  AST 66*  ALT 19  ALKPHOS 77  BILITOT 0.9  PROT 4.7*  ALBUMIN 1.6*   CBC:  Recent Labs Lab 02/01/2016 1831 02/07/2016 2008 02/05/16 0500 02/06/16 0257  WBC 24.0* 27.8* 25.1* 27.3*  NEUTROABS 23.0*  --   --   --   HGB 5.4* 4.8* 9.1* 9.0*  HCT 19.3* 19.2* 29.0* 29.1*  MCV 70.9* 81.4 71.5* 71.6*  PLT 93* 81* 98* 72*   Cardiac Enzymes:  Recent Labs Lab 01/20/2016 1831  TROPONINI 0.03*   CBG:  Recent Labs  Lab 02/06/16 0618 02/06/16 0800 02/06/16 1226 02/06/16 1635 02/07/16 0324  GLUCAP 83 107* 122* 121* 122*    Recent Results (from the past 240 hour(s))  Blood Culture (routine x 2)     Status: None (Preliminary result)   Collection Time: 01/24/2016  6:31 PM  Result Value Ref Range Status   Specimen Description BLOOD  LEFT AC  Final   Special Requests   Final    BOTTLES DRAWN AEROBIC AND ANAEROBIC  AER 11 ML ANA 8 ML   Culture NO GROWTH 3 DAYS  Final   Report Status PENDING  Incomplete  Blood Culture (routine x 2)     Status: None (Preliminary result)   Collection Time: 01/27/2016  6:31 PM  Result Value Ref Range Status   Specimen Description BLOOD  LEFT FOREARM  Final   Special Requests   Final    BOTTLES DRAWN AEROBIC AND ANAEROBIC  AER 9 ML ANA 10 ML   Culture NO GROWTH 3 DAYS  Final   Report Status PENDING  Incomplete  Urine culture     Status: None   Collection Time: 02/03/2016  8:56 PM  Result Value Ref Range Status   Specimen Description URINE, RANDOM  Final   Special Requests NONE  Final   Culture   Final    NO GROWTH Performed at [facility], [place] [address], [place], [place] [address]    Report Status 02/06/2016 FINAL  Final  MRSA PCR Screening     Status: None   Collection Time: 02/05/16  3:24 PM  Result Value Ref Range Status   MRSA by PCR NEGATIVE NEGATIVE Final    Comment:        The GeneXpert MRSA Assay (FDA approved for NASAL specimens only), is one component of a comprehensive MRSA colonization surveillance program. It is not intended to diagnose MRSA infection nor to guide or monitor treatment for MRSA infections.       Scheduled Meds: . aspirin EC  81 mg Oral Daily  . cefTRIAXone (ROCEPHIN)  IV  1 g Intravenous Q24H  . chlorhexidine  15 mL Mouth Rinse BID  . donepezil  10 mg Oral QHS  . escitalopram  20 mg Oral Daily  . fluticasone furoate-vilanterol  1 puff Inhalation Daily  . insulin aspart  0-9 Units Subcutaneous TID WC  . mouth rinse  15 mL Mouth Rinse q12n4p  . pantoprazole (PROTONIX) IV  40 mg Intravenous Q12H  . tamsulosin  0.4 mg Oral Daily   Continuous Infusions: . dextrose 100 mL/hr at 02/07/16 0143    Assessment/Plan:  1. Severe hypernatremia. When I see this the overall prognosis is poor. Case discussed with family on the phone and they are interested in hydration at this point but if things don't improve go back to the facility with hospice. Patient is a DO NOT RESUSCITATE. Patient also did not passed swallow evaluation at this point. 2. Severe septic shock with hypotension and altered mental status and lactic acidosis. All cultures are negative. Discontinue aggressive antibiotics and switched to Rocephin. Blood pressure still on the lower side. Continue IV fluids. No pressors at this time. 3. Acute kidney injury on chronic kidney disease. Continue IV fluid hydration. Discontinue Foley catheter. 4. Hypoglycemia on presentation. On D5 drip. 5. Severe anemia. Patient was transfused. And last hemoglobin 9.0. 6. Thrombocytopenia. Could be with severe sepsis. But looking back at old  labs he did have lower side platelets in the past also. 7. Severe  dementia without behavioral disturbance. 8. 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 9. Left hand Gray and fingertips starting to turn black. Case discussed with vascular surgery to evaluate.  Code Status:     Code Status Orders        Start     Ordered   02/05/16 0038  Do not attempt resuscitation (DNR)  Continuous    Question Answer Comment  In the event of cardiac or respiratory ARREST Do not call a "code blue"   In the event of cardiac or respiratory ARREST Do not perform Intubation, CPR, defibrillation or ACLS   In the event of cardiac or respiratory ARREST Use medication by any route, position, wound care, and other measures to relive pain and suffering. May use oxygen, suction and manual treatment of airway obstruction as needed for comfort.      02/05/16 0037    Code Status History    Date Active Date Inactive Code Status Order ID Comments User Context   This patient has a current code status but no historical code status.    Advance Directive Documentation   Flowsheet Row Most Recent Value  Type of Advance Directive  Healthcare Power of Attorney  Pre-existing out of facility DNR order (yellow form or pink MOST form)  No data  "MOST" Form in Place?  No data     Family Communication: Spoke with family member [name] Disposition Plan: Likely hydration through the weekend and potential disposition to facility with hospice next week.  Consultants: Palliative care Nephrology Vascular surgery  Antibiotics:  Rocephin  Time spent: 46mnutes  [facility]

## 2016-02-07 NOTE — Plan of Care (Signed)
Problem: Education: Goal: Knowledge of [facility] Education information/materials will improve Outcome: Progressing BP 85/51 at AM VS check, Dr. Estanislado Pandy paged, 250cc NS bolus ordered.  Pt resting comfortably throughout shift.  Wound care provided per order.  No calls from telesitter.  Bed in low position, bed alarm on.  Call bell within reach, [facility].

## 2016-02-08 LAB — GLUCOSE, CAPILLARY
GLUCOSE-CAPILLARY: 118 mg/dL — AB (ref 65–99)
Glucose-Capillary: 103 mg/dL — ABNORMAL HIGH (ref 65–99)
Glucose-Capillary: 123 mg/dL — ABNORMAL HIGH (ref 65–99)

## 2016-02-08 MED ORDER — SODIUM CHLORIDE 0.9 % IV SOLN
3.0000 mg/h | INTRAVENOUS | Status: DC
  Administered 2016-02-08: 12:00:00 3 mg/h via INTRAVENOUS
  Filled 2016-02-08: qty 10

## 2016-02-08 NOTE — Progress Notes (Signed)
Pt's breathing pattern has changed to agonal breathing. Ammie Dalton, RN

## 2016-02-08 NOTE — Progress Notes (Signed)
Pt is now on comfort care. Pt has multiple new skin tears on his left arm from swelling r/t no blood flow. Morphine drip will be started when pharmacy brings it up. Ammie Dalton, RN

## 2016-02-08 NOTE — Progress Notes (Signed)
Wound care provided per order.  New weeping skin tears L hand, wrist.  Cleansed, OTA.

## 2016-02-08 NOTE — Clinical Social Work Note (Signed)
CSW contacted RN Debbie from [facility] that patient is not stable enough for transport at this time. Debbie reported that Flo Shanks will follow-up on Feb 13, 2016 to reassess. CSW will con't to follow.  Santiago Bumpers, MSW, Latanya Presser [phone]

## 2016-02-08 NOTE — Plan of Care (Signed)
Problem: Education: Goal: Knowledge of [facility] Education information/materials will improve Outcome: Progressing Hypotensive, stable from previous shift.  Pt resting comfortably during shift, no PRN pain meds given.  Incontinent of urine, stool.  Bed in low position, bed alarm on, tele sitter continued.  WCTM.

## 2016-02-08 NOTE — Clinical Social Work Note (Signed)
CSW received verbal consult for comfort care and possible hospice home referral. Referral sent to RN Debbie [facility]. At this time, no beds are available. CSW is following.  Santiago Bumpers, MSW, Latanya Presser [phone]

## 2016-02-08 NOTE — Plan of Care (Signed)
Problem: Pain Management: Goal: General experience of comfort will improve Outcome: Progressing Pt now on morphine drip and resting comfortably

## 2016-02-08 NOTE — Progress Notes (Signed)
Patient ID: Gene [name], male   DOB: 22-May-1927, 83 y.o.   MRN: [id number]  ACP discussion  Family [name] at the bedside, Patient unable to participate in discussion  Overall prognosis is poor. Patient made comfort care and started on morphine drip. No hospice home beds available today  Dx: dementia end stage, unable to safely swallow, Severe hypernatremia, hypotension, Acute kidney injury and gangrene left finger and starting on toes.  Patient will not survive much longer and made comfort care  Dr Leslye Peer  Time with ACP discussion 17 minutes

## 2016-02-08 NOTE — Progress Notes (Signed)
Patient ID: Gene [name], male   DOB: 05-Nov-1927, 83 y.o.   MRN: [id number]   [facility] PROGRESS NOTE  Gene [name] RSW:[id number] DOB: [birth year] DOA: 02/03/2016 PCP: Gene Athens, MD  HPI/Subjective: Patient is able to look at me, tries to talk but unable to understand  Objective: Vitals:   02/08/16 0426 02/08/16 1024  BP: (!) 99/50 (!) 92/55  Pulse: 87 (!) 58  Resp:    Temp: 98.4 F (36.9 C) 97.8 F (36.6 C)    Filed Weights   02/03/2016 1850 02/05/16 1205  Weight: 48 kg (105 lb 13.1 oz) 48 kg (105 lb 12.8 oz)    ROS: Review of Systems  Unable to perform ROS: Dementia   Exam: Physical Exam  HENT:  Nose: No mucosal edema.  Mouth/Throat: No oropharyngeal exudate or posterior oropharyngeal edema.  Eyes: Conjunctivae and lids are normal. Pupils are equal, round, and reactive to light.  Neck: No JVD present. Carotid bruit is not present. No edema present. No thyromegaly present.  Cardiovascular: Regular rhythm, S1 normal and S2 normal.   Murmur heard.  Systolic murmur is present with a grade of 2/6  Respiratory: He has no decreased breath sounds. He has no wheezes. He has no rhonchi. He has no rales.  GI: Soft. There is no tenderness.  Musculoskeletal:       Right ankle: He exhibits no swelling.       Left ankle: He exhibits no swelling.  Neurological: He is alert.  Skin:  Numerous areas of skin breakdown and scabs seen on his knees. Patient with blistering left arm. Dark gey duskiness left arm, blackened fingers.  Toes also starting to look black.  Psychiatric:  Patient is alert. Moves his arms and legs.      Data Reviewed: Basic Metabolic Panel:  Recent Labs Lab 01/29/2016 1831 01/22/2016 2051 02/05/16 0500  02/05/16 2039 02/05/16 2338 02/06/16 0257 02/06/16 0838 02/07/16 0451  NA 159* 158* 159*  < > 160* 163* 161*  161* 159* 157*  K 6.7* 5.6* 3.7  --   --  4.6 3.4*  --  3.0*  CL 121*  --  129*  --   --  >130* >130*  --  >130*  CO2 14*  --   18*  --   --  12* 14*  --  16*  GLUCOSE 409*  --  361*  --   --  151* 117*  --  135*  BUN 111*  --  97*  --   --  111* 105*  --  98*  CREATININE 4.97*  --  4.09*  --   --  4.59* 4.74*  --  4.43*  CALCIUM 7.0*  --  5.7*  --   --  6.7* 6.6*  --  7.1*  < > = values in this interval not displayed. Liver Function Tests:  Recent Labs Lab 02/17/2016 1831  AST 66*  ALT 19  ALKPHOS 77  BILITOT 0.9  PROT 4.7*  ALBUMIN 1.6*   CBC:  Recent Labs Lab 01/23/2016 1831 02/14/2016 2008 02/05/16 0500 02/06/16 0257  WBC 24.0* 27.8* 25.1* 27.3*  NEUTROABS 23.0*  --   --   --   HGB 5.4* 4.8* 9.1* 9.0*  HCT 19.3* 19.2* 29.0* 29.1*  MCV 70.9* 81.4 71.5* 71.6*  PLT 93* 81* 98* 72*   Cardiac Enzymes:  Recent Labs Lab 01/26/2016 1831  TROPONINI 0.03*   CBG:  Recent Labs Lab 02/07/16 1624 02/07/16 1809 02/08/16 0010 02/08/16 0938 02/08/16 1829  GLUCAP 124* 115* 103* 118* 123*    Recent Results (from the past 240 hour(s))  Blood Culture (routine x 2)     Status: None (Preliminary result)   Collection Time: 01/23/2016  6:31 PM  Result Value Ref Range Status   Specimen Description BLOOD  LEFT AC  Final   Special Requests   Final    BOTTLES DRAWN AEROBIC AND ANAEROBIC  AER 11 ML ANA 8 ML   Culture NO GROWTH 4 DAYS  Final   Report Status PENDING  Incomplete  Blood Culture (routine x 2)     Status: None (Preliminary result)   Collection Time: 02/11/2016  6:31 PM  Result Value Ref Range Status   Specimen Description BLOOD  LEFT FOREARM  Final   Special Requests   Final    BOTTLES DRAWN AEROBIC AND ANAEROBIC  AER 9 ML ANA 10 ML   Culture NO GROWTH 4 DAYS  Final   Report Status PENDING  Incomplete  Urine culture     Status: None   Collection Time: 02/11/2016  8:56 PM  Result Value Ref Range Status   Specimen Description URINE, RANDOM  Final   Special Requests NONE  Final   Culture   Final    NO GROWTH Performed at [facility], [place] [address], [place], [place] [address]    Report  Status 02/06/2016 FINAL  Final  MRSA PCR Screening     Status: None   Collection Time: 02/05/16  3:24 PM  Result Value Ref Range Status   MRSA by PCR NEGATIVE NEGATIVE Final    Comment:        The GeneXpert MRSA Assay (FDA approved for NASAL specimens only), is one component of a comprehensive MRSA colonization surveillance program. It is not intended to diagnose MRSA infection nor to guide or monitor treatment for MRSA infections.       Scheduled Meds: . chlorhexidine  15 mL Mouth Rinse BID  . mouth rinse  15 mL Mouth Rinse q12n4p   Continuous Infusions: . morphine      Assessment/Plan:  1. Severe hypernatremia. When I see this the overall prognosis is poor. Patient is a DO NOT RESUSCITATE. Patient also did not passed swallow evaluation at this point. 2. Severe septic shock with hypotension and altered mental status and lactic acidosis. Stop antibiotics 3. Acute kidney injury on chronic kidney disease. Stop fluids 4. Hypoglycemia on presentation. Stop fluids 5. Severe anemia. Patient was transfused. And last hemoglobin 9.0. 6. Thrombocytopenia. Could be with severe sepsis. But looking back at old labs he did have lower side platelets in the past also. 7. Severe dementia without behavioral disturbance. 8. 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 9. Gangrene left fingers and starting on toes.  Patient made comfort care with family at bedside. Morphine drip ordered. Unfortunately, no hospice home beds at this point.  Code Status:     Code Status Orders        Start     Ordered   02/05/16 0038  Do not attempt resuscitation (DNR)  Continuous    Question Answer Comment  In the event of cardiac or respiratory ARREST Do not call a "code blue"   In the event of cardiac or respiratory ARREST Do not perform Intubation, CPR, defibrillation or ACLS   In the event of cardiac or respiratory ARREST Use medication by any route,  position, wound care, and other measures to relive pain and suffering.  May use oxygen, suction and manual treatment of airway obstruction as needed for comfort.      02/05/16 0037    Code Status History    Date Active Date Inactive Code Status Order ID Comments User Context   This patient has a current code status but no historical code status.    Advance Directive Documentation   Flowsheet Row Most Recent Value  Type of Advance Directive  Healthcare Power of Attorney  Pre-existing out of facility DNR order (yellow form or pink MOST form)  No data  "MOST" Form in Place?  No data     Family Communication: Spoke with family member [name] at the bedside Disposition Plan: Potential for hospice home once bed available  Consultants: Palliative care Nephrology Vascular surgery  Antibiotics:  Rocephin  Time spent: 35 minutes including ACP time  [place], [place]

## 2016-02-09 LAB — CULTURE, BLOOD (ROUTINE X 2)
Culture: NO GROWTH
Culture: NO GROWTH

## 2016-02-19 NOTE — Progress Notes (Signed)
Patient ID: Gene [name], male   DOB: 08/11/1927, 83 y.o.   MRN: [id number]  I walked into the room, no sponataneous respirations or heart beat. No corneal reflex or pupillary reaction. Patient pronounced dead at 7:32 am.  I spoked with Gene [name] patients family and gave an update, she thanked me for my care.  Dr Leslye Peer

## 2016-02-19 NOTE — Discharge Summary (Signed)
[facility] [place] NAME: Gene [name]    MR#:  [id number]  DATE OF BIRTH:  1927-08-24  DATE OF ADMISSION:  01/21/2016 ADMITTING PHYSICIAN: Bettey Costa, MD  DATE OF DEATH: 2016-02-16 at 7:23am  PRIMARY CARE PHYSICIAN: MASOUD,JAVED, MD    ADMISSION DIAGNOSIS:  Hyperkalemia [E87.5] Hypoglycemia [E16.2] Unresponsiveness [R41.89] Hypotension, unspecified hypotension type [I95.9] Acute renal failure, unspecified acute renal failure type ([place]) [N17.9] Anemia, unspecified type [D64.9]  DISCHARGE DIAGNOSIS:  Active Problems:   Sepsis ([place])   Alzheimer's dementia without behavioral disturbance   Acute renal failure ([place])   Palliative care by specialist   Goals of care, counseling/discussion   Hypotension   Hypoglycemia   Protein-calorie malnutrition, severe   Pressure injury of skin   SECONDARY DIAGNOSIS:   Past Medical History:  Diagnosis Date  . AD (Alzheimer's disease) 10/22/2013  . Amnesia 10/22/2013  . Anxiety   . Cardiomyopathy ([place]) 10/01/2014    HOSPITAL COURSE:   1.  Septic shock with hypotension and altered mental status and lactic acidosis. Cultures were negative. Patient had gangrene of the left fingers and starting of the hand and blistering on his left arm. And starting of gangrene on his toes. Patient was made comfort care and started on morphine drip and passed away on 2016-02-16 at 7:23 AM. I pronounced him myself. 2. Severe hypernatremia the patient was placed on D5 drip. When I see this the overall prognosis is poor. The patient did not pass a swallow evaluation during the hospital course. Patient was a DO NOT RESUSCITATE. 3. Acute kidney injury on chronic kidney disease stage III. Patient was given IV fluids until made comfort care. 4. Hypoglycemia on presentation the patient was on D5 drip 5. Severe anemia the patient was transfused during the hospital course 6. Thrombocytopenia secondary to severe sepsis 7.  Severe dementia with out behavioral disturbance 8. 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 Gangrene left fingers and hand and starting on toes.   CONSULTS OBTAINED:  Treatment Team:  Lavonia Dana, MD    DATA REVIEW:   CBC  Recent Labs Lab 02/06/16 0257  WBC 27.3*  HGB 9.0*  HCT 29.1*  PLT 72*    Chemistries   Recent Labs Lab 02/01/2016 1831  02/07/16 0451  NA 159*  < > 157*  K 6.7*  < > 3.0*  CL 121*  < > >130*  CO2 14*  < > 16*  GLUCOSE 409*  < > 135*  BUN 111*  < > 98*  CREATININE 4.97*  < > 4.43*  CALCIUM 7.0*  < > 7.1*  AST 66*  --   --   ALT 19  --   --   ALKPHOS 77  --   --   BILITOT 0.9  --   --   < > = values in this interval not displayed.  Cardiac Enzymes  Recent Labs Lab 02/12/2016 1831  TROPONINI 0.03*    Microbiology Results  Results for orders placed or performed during the hospital encounter of 01/25/2016  Blood Culture (routine x 2)     Status: None   Collection Time: 01/20/2016  6:31 PM  Result Value Ref Range Status   Specimen Description BLOOD  LEFT AC  Final   Special Requests   Final    BOTTLES DRAWN AEROBIC AND ANAEROBIC  AER 11 ML ANA 8 ML   Culture NO GROWTH 5 DAYS  Final  Report Status 08-Mar-2016 FINAL  Final  Blood Culture (routine x 2)     Status: None   Collection Time: 02/03/2016  6:31 PM  Result Value Ref Range Status   Specimen Description BLOOD  LEFT FOREARM  Final   Special Requests   Final    BOTTLES DRAWN AEROBIC AND ANAEROBIC  AER 9 ML ANA 10 ML   Culture NO GROWTH 5 DAYS  Final   Report Status 08-Mar-2016 FINAL  Final  Urine culture     Status: None   Collection Time: 02/05/2016  8:56 PM  Result Value Ref Range Status   Specimen Description URINE, RANDOM  Final   Special Requests NONE  Final   Culture   Final    NO GROWTH Performed at [facility], [place] [address] [place], [place] [address]    Report Status 02/06/2016 FINAL  Final  MRSA PCR Screening      Status: None   Collection Time: 02/05/16  3:24 PM  Result Value Ref Range Status   MRSA by PCR NEGATIVE NEGATIVE Final    Comment:        The GeneXpert MRSA Assay (FDA approved for NASAL specimens only), is one component of a comprehensive MRSA colonization surveillance program. It is not intended to diagnose MRSA infection nor to guide or monitor treatment for MRSA infections.        Loletha Grayer M.D on 02/10/2016 at 6:15 PM  Between 7am to 6pm - Pager - [phone]  After 6pm go to [email or web address] - password [email or web address]  [facility] Office  [phone]  CC: Primary care physician; Cletis Athens, MD

## 2016-02-19 DEATH — deceased

## 2017-08-23 IMAGING — CT CT CHEST W/ CM
1 series · 14 of 34 positions shown, 18 images · IV contrast (iopamidol)
Comparison: 09/05/2015 chest radiograph.  09/25/2010 chest CT.

CLINICAL DATA: Abnormal chest x-ray. Weight loss. COPD. Thirty-five
pack-year smoking history. Alzheimer's disease.

EXAM:
CT CHEST WITH CONTRAST
TECHNIQUE: Multidetector CT imaging of the chest was performed during
intravenous contrast administration.
CONTRAST:  60mL 5NBGQ8-FBB IOPAMIDOL (5NBGQ8-FBB) INJECTION 61%

[Series 3: axial st · axial · 0.65mm/px · z∈[-622,-370]mm · 14 of 150 slices shown, 18 images]
[im 12/150  mediastinal]
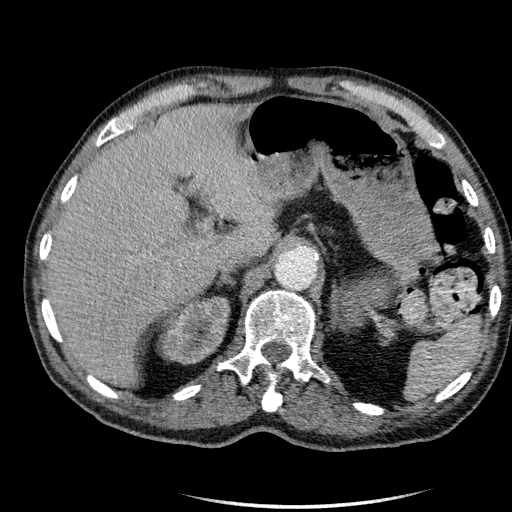
[im 12/150  lung]
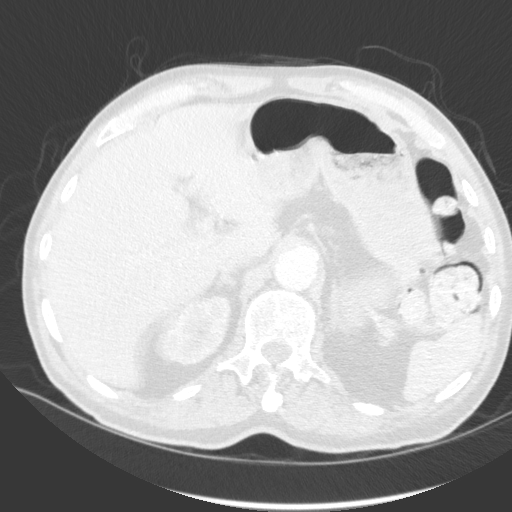
[im 23/150  lung]
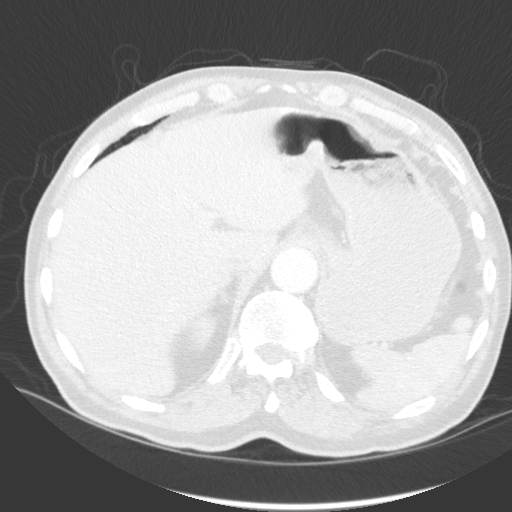
[im 30/150  lung]
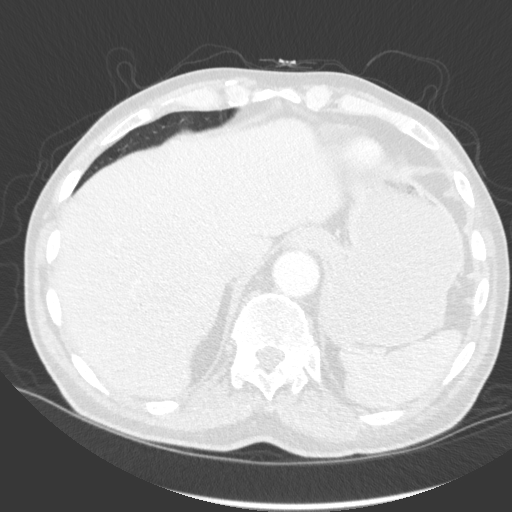
[im 45/150  lung]
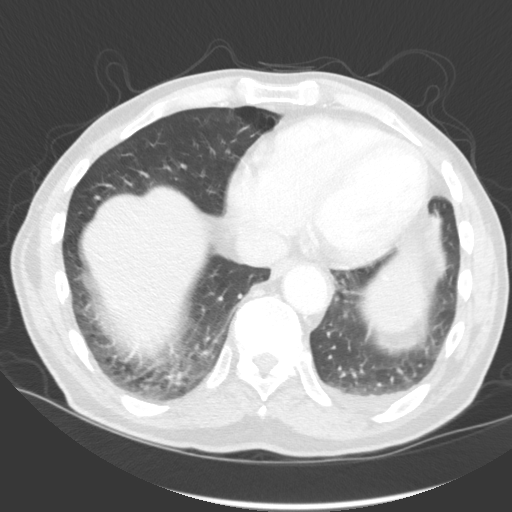
[im 56/150  mediastinal]
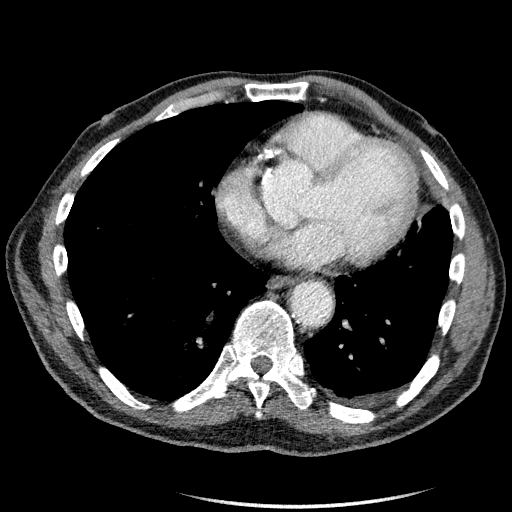
[im 56/150  lung]
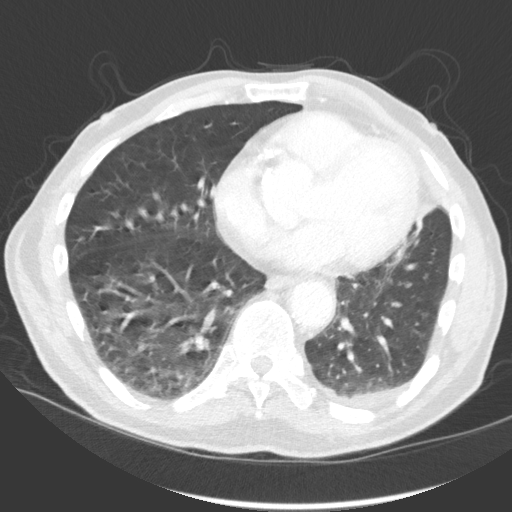
[im 61/150  lung]
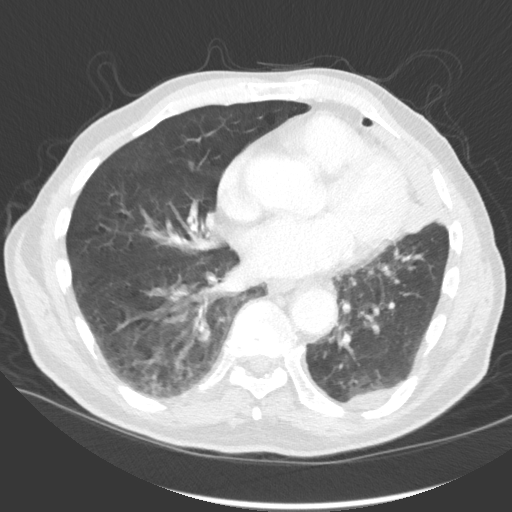
[im 71/150  lung]
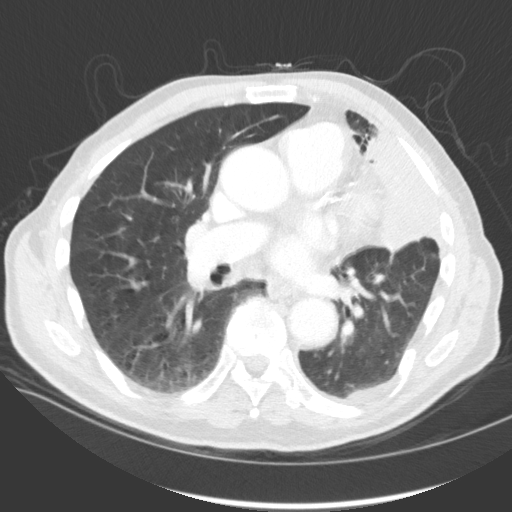
[im 80/150  lung]
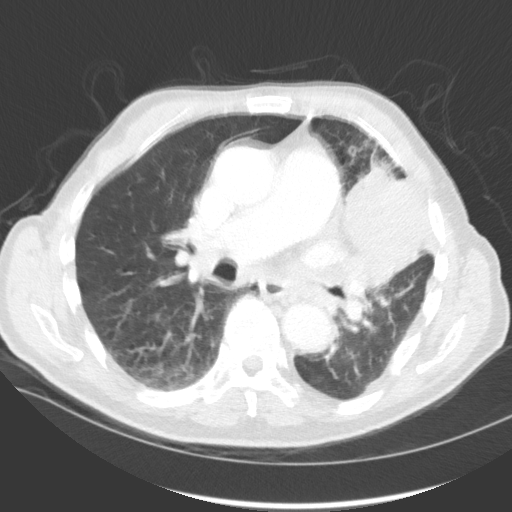
[im 89/150  mediastinal]
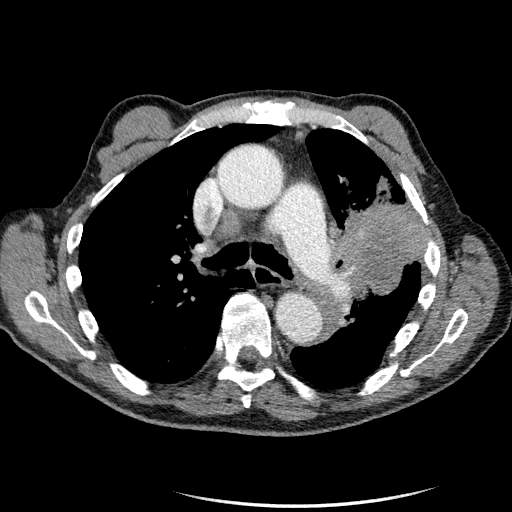
[im 89/150  lung]
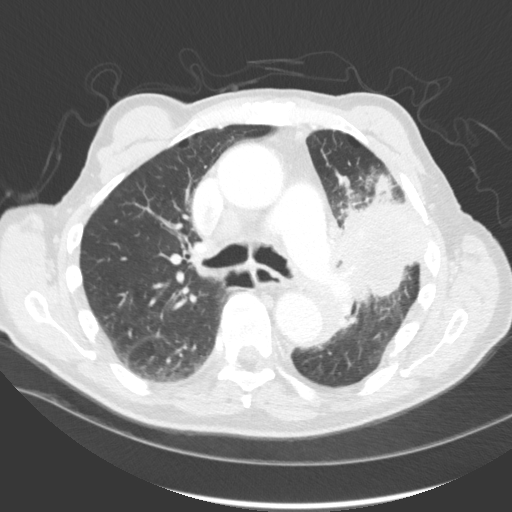
[im 94/150  lung]
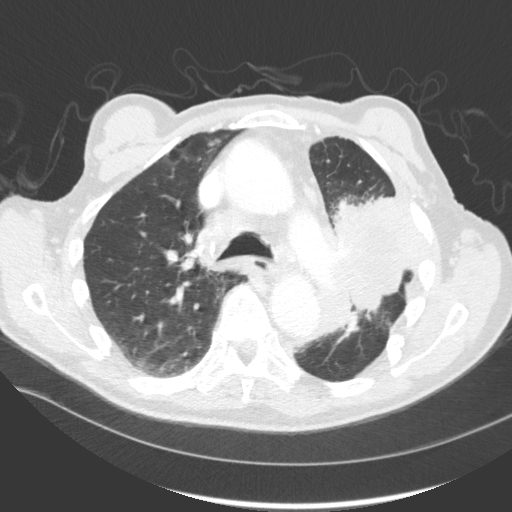
[im 111/150  lung]
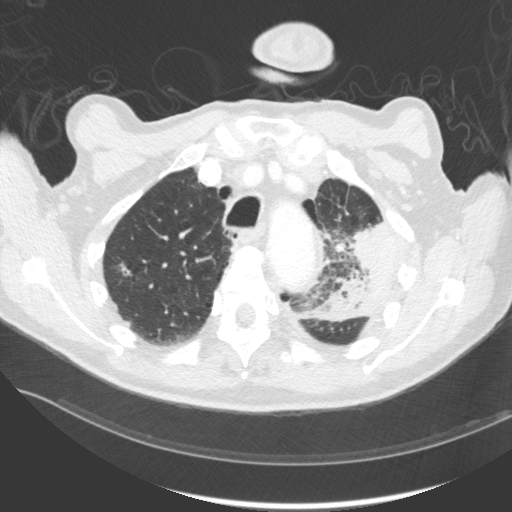
[im 120/150  lung]
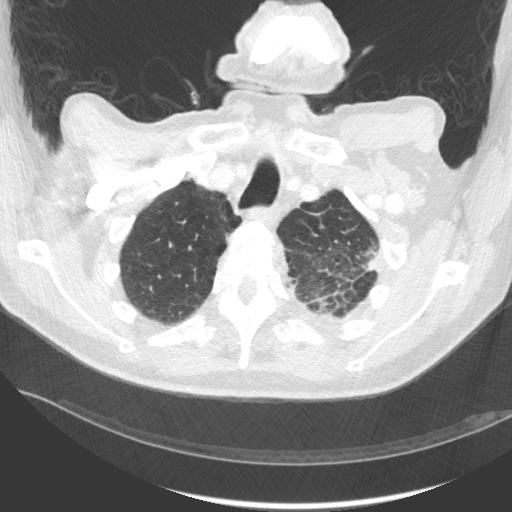
[im 127/150  mediastinal]
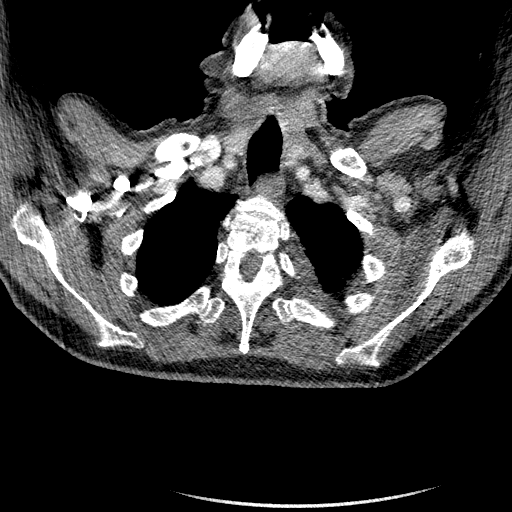
[im 127/150  lung]
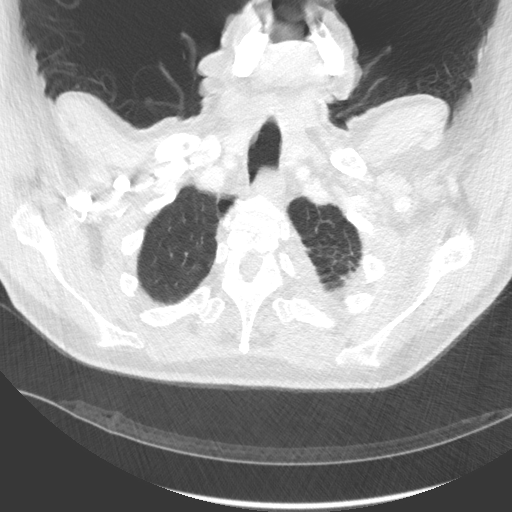
[im 138/150  lung]
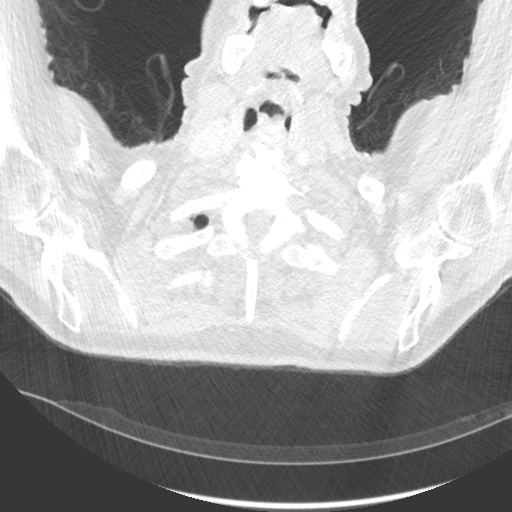

[14 of 34 positions shown; findings below may reference images not displayed]

FINDINGS: Cardiovascular: Aortic and branch vessel atherosclerosis. Mild
ascending aortic dilatation, including at 4.2 cm. Tortuous
descending thoracic aorta. Moderate cardiomegaly. Multivessel
coronary artery atherosclerosis. No central pulmonary embolism, on
this non-dedicated study. Left upper lobe pulmonary artery branches
are surrounded by tumor. Pulmonary artery enlargement, including
cm outflow tract.

Mediastinum/Nodes: Right paratracheal adenopathy at 1.8 cm on image
60/series 3. Right hilar nodal tissue is upper normal at 1.3 cm.
Multiple prevascular nodes are suspicious based on multiplicity and
location.

Lungs/Pleura: Small left pleural effusion. Mild motion degradation
throughout. Mild centrilobular and moderate paraseptal emphysema.

Left upper lobe bronchial obstruction. Mass-effect upon left lower
lobe bronchi.

Right apical pulmonary nodule measures 7 mm on image 39/series 4.

10 mm right upper lobe pulmonary nodule on image 58/series 4.

Volume loss and subsegmental atelectasis at the lung bases, worse on
the right.

left lower lobe pulmonary nodule of 9 mm on image 111/series 4.

A left upper lobe infiltrative lung mass involves a large portion of
the lobe. Maximal transverse dimensions on the order of 6.6 x
cm. Demonstrates direct involvement of the left hilum, extension of
the left-sided mediastinum, including on image 61/series 3. More
cephalad mild septal thickening and ground-glass opacity in the left
upper lobe may be due to postobstructive pneumonitis.

Upper Abdomen: Motion degradation continuing into the upper abdomen.
Grossly normal image portions of the liver, spleen, stomach,
pancreas, adrenal glands, kidneys.

Musculoskeletal: Left-sided rib deformities are likely related to
remote trauma. No gross osseous destruction to confirm chest wall
invasion. Thoracic spondylosis. Tiny nonspecific posterior L1 lucent
lesion at 5 mm on sagittal image 83.
IMPRESSION: 1. Mildly motion degraded exam.
2. Infiltrative left upper lobe primary bronchogenic carcinoma with
direct extension into the left hilum and left side of the
mediastinum.
3. Right paratracheal adenopathy, consistent with nodal metastasis.
4.  Coronary artery atherosclerosis. Aortic atherosclerosis.
5. Pulmonary artery enlargement suggests pulmonary arterial
hypertension.
6. Mild ascending aortic dilatation.
7. Small left pleural effusion.
8. Bilateral pulmonary nodules, suspicious for metastasis.

## 2018-01-08 IMAGING — DX DG CHEST 1V PORT
2 series · 2 of 2 positions shown · non-contrast
Comparison: 09/05/2015

CLINICAL DATA: Found unresponsive.

EXAM:
PORTABLE CHEST 1 VIEW

[chest ap (1 of 2)]
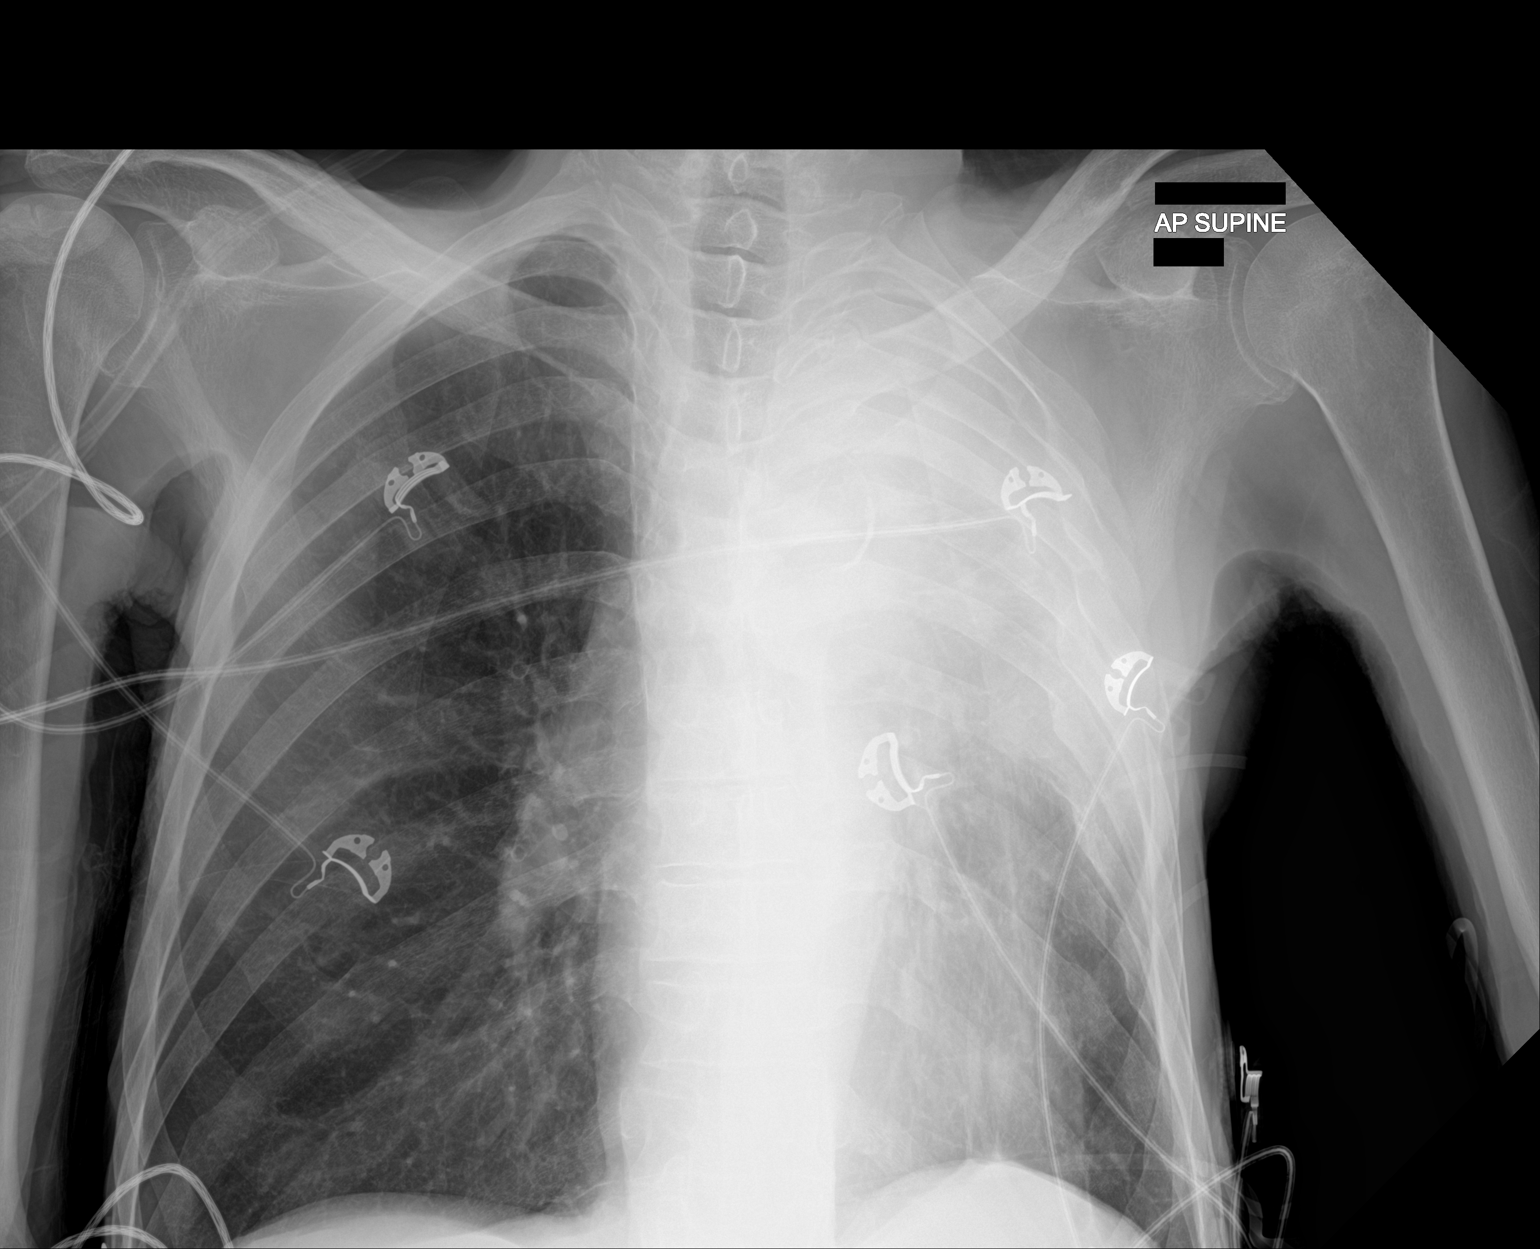

[chest ap (2 of 2)]
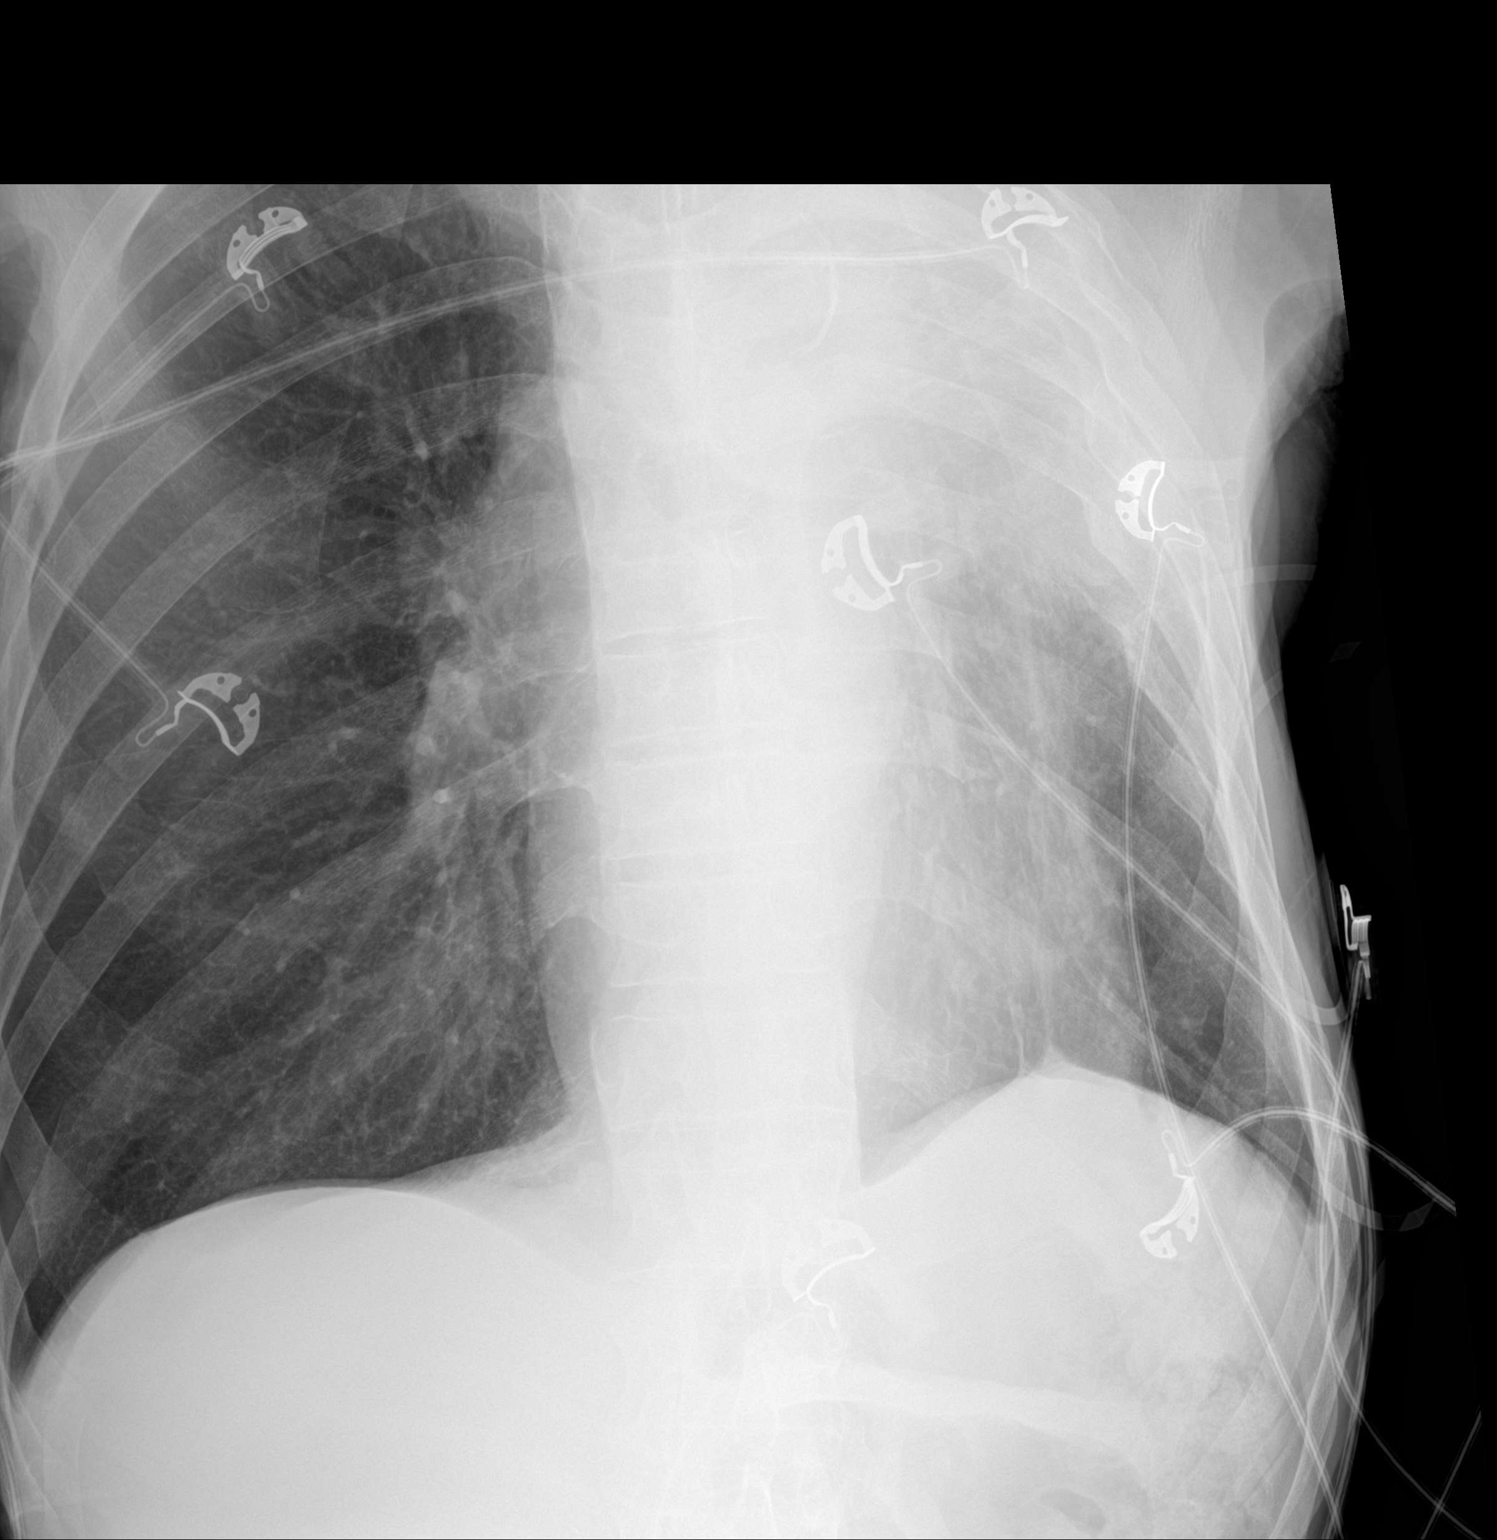

[2 of 2 positions shown; findings below may reference images not displayed]

FINDINGS: Lungs are hyperexpanded as before. The left upper lobe neoplasm has
progressed in the interval with complete opacification of the left
upper lung on the current study. Right lung is clear. Heart size
stable. The visualized bony structures of the thorax are intact.
Multiple old posterior left rib fractures noted. Telemetry leads
overlie the chest.
IMPRESSION: Interval progression of known left upper lobe lung mass.

Emphysema.
